# Patient Record
Sex: Male | Born: 1960 | Race: Black or African American | Hispanic: No | Marital: Single
Health system: Midwestern US, Community
[De-identification: ages and names within clinical notes are randomized; demographics above are authoritative.]

## PROBLEM LIST (undated history)

## (undated) DIAGNOSIS — Z8673 Personal history of transient ischemic attack (TIA), and cerebral infarction without residual deficits: Secondary | ICD-10-CM

## (undated) DIAGNOSIS — I77810 Thoracic aortic ectasia: Secondary | ICD-10-CM

## (undated) DIAGNOSIS — N183 Chronic kidney disease, stage 3 unspecified: Secondary | ICD-10-CM

## (undated) DIAGNOSIS — I779 Disorder of arteries and arterioles, unspecified: Secondary | ICD-10-CM

## (undated) DIAGNOSIS — Z789 Other specified health status: Secondary | ICD-10-CM

## (undated) DIAGNOSIS — I2510 Atherosclerotic heart disease of native coronary artery without angina pectoris: Secondary | ICD-10-CM

## (undated) DIAGNOSIS — F1490 Cocaine use, unspecified, uncomplicated: Secondary | ICD-10-CM

## (undated) DIAGNOSIS — I517 Cardiomegaly: Secondary | ICD-10-CM

## (undated) DIAGNOSIS — F1090 Alcohol use, unspecified, uncomplicated: Secondary | ICD-10-CM

## (undated) DIAGNOSIS — N529 Male erectile dysfunction, unspecified: Secondary | ICD-10-CM

## (undated) DIAGNOSIS — K759 Inflammatory liver disease, unspecified: Secondary | ICD-10-CM

## (undated) DIAGNOSIS — Z720 Tobacco use: Secondary | ICD-10-CM

## (undated) DIAGNOSIS — I10 Essential (primary) hypertension: Secondary | ICD-10-CM

## (undated) DIAGNOSIS — I340 Nonrheumatic mitral (valve) insufficiency: Secondary | ICD-10-CM

## (undated) HISTORY — DX: Thoracic aortic ectasia: I77.810

## (undated) HISTORY — DX: Male erectile dysfunction, unspecified: N52.9

## (undated) HISTORY — DX: Nonrheumatic mitral (valve) insufficiency: I34.0

## (undated) HISTORY — PX: BACK SURGERY: SHX140

## (undated) HISTORY — PX: JOINT REPLACEMENT: SHX530

## (undated) HISTORY — PX: FRACTURE SURGERY: SHX138

## (undated) HISTORY — DX: Tobacco use: Z72.0

## (undated) HISTORY — DX: Other specified health status: Z78.9

## (undated) HISTORY — DX: Alcohol use, unspecified, uncomplicated: F10.90

## (undated) HISTORY — DX: Disorder of arteries and arterioles, unspecified: I77.9

## (undated) HISTORY — DX: Atherosclerotic heart disease of native coronary artery without angina pectoris: I25.10

## (undated) HISTORY — DX: Cocaine use, unspecified, uncomplicated: F14.90

## (undated) HISTORY — DX: Cardiomegaly: I51.7

---

## 2011-03-02 ENCOUNTER — Ambulatory Visit: Payer: Self-pay | Admitting: Gastroenterology

## 2011-03-16 ENCOUNTER — Other Ambulatory Visit: Payer: Self-pay | Admitting: Gastroenterology

## 2011-03-16 ENCOUNTER — Ambulatory Visit (INDEPENDENT_AMBULATORY_CARE_PROVIDER_SITE_OTHER): Payer: Self-pay | Admitting: Gastroenterology

## 2011-03-16 DIAGNOSIS — B182 Chronic viral hepatitis C: Secondary | ICD-10-CM

## 2011-03-16 LAB — CBC WITH DIFFERENTIAL/PLATELET
Basophils Absolute: 0 10*3/uL (ref 0.0–0.1)
Basophils Relative: 1 % (ref 0–1)
Eosinophils Absolute: 0.1 10*3/uL (ref 0.0–0.7)
Eosinophils Relative: 2 % (ref 0–5)
MCH: 33.8 pg (ref 26.0–34.0)
MCV: 93.9 fL (ref 78.0–100.0)
Platelets: 117 10*3/uL — ABNORMAL LOW (ref 150–400)
RDW: 11.5 % (ref 11.5–15.5)

## 2011-03-16 LAB — IRON: Iron: 161 ug/dL (ref 42–165)

## 2011-03-16 LAB — IBC PANEL
%SAT: 48 % (ref 20–55)
TIBC: 334 ug/dL (ref 215–435)

## 2011-03-17 LAB — COMPLETE METABOLIC PANEL WITH GFR
ALT: 140 U/L — ABNORMAL HIGH (ref 0–53)
AST: 134 U/L — ABNORMAL HIGH (ref 0–37)
Alkaline Phosphatase: 89 U/L (ref 39–117)
Creat: 1.22 mg/dL (ref 0.50–1.35)
GFR, Est African American: 79 mL/min
Total Bilirubin: 0.7 mg/dL (ref 0.3–1.2)

## 2011-03-17 LAB — HEPATITIS B SURFACE ANTIBODY,QUALITATIVE: Hep B S Ab: NEGATIVE

## 2011-03-17 LAB — HEPATITIS B CORE ANTIBODY, TOTAL: Hep B Core Total Ab: NEGATIVE

## 2011-03-17 LAB — HEPATITIS B SURFACE ANTIGEN: Hepatitis B Surface Ag: NEGATIVE

## 2011-03-20 LAB — HEPATITIS C RNA QUANTITATIVE: HCV Quantitative Log: 6.73 {Log} — ABNORMAL HIGH (ref <1.63)
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
## 2011-04-13 NOTE — Progress Notes (Signed)
Addended by: Brooke Dare on: 04/13/2011 10:23 PM   Modules accepted: Orders

## 2011-07-06 ENCOUNTER — Other Ambulatory Visit: Payer: Self-pay | Admitting: Radiology

## 2011-07-07 ENCOUNTER — Encounter (HOSPITAL_COMMUNITY): Payer: Self-pay | Admitting: Pharmacy Technician

## 2011-07-11 ENCOUNTER — Encounter (HOSPITAL_COMMUNITY): Payer: Self-pay

## 2011-07-11 ENCOUNTER — Ambulatory Visit (HOSPITAL_COMMUNITY)
Admission: RE | Admit: 2011-07-11 | Discharge: 2011-07-11 | Disposition: A | Discharge status: Home / Self Care | Payer: Self-pay | Source: Ambulatory Visit | Attending: Gastroenterology | Admitting: Gastroenterology

## 2011-07-11 VITALS — BP 155/89 | HR 58 | Temp 97.2°F | Resp 18 | Ht 69.0 in | Wt 165.0 lb

## 2011-07-11 DIAGNOSIS — B1920 Unspecified viral hepatitis C without hepatic coma: Secondary | ICD-10-CM | POA: Insufficient documentation

## 2011-07-11 DIAGNOSIS — B182 Chronic viral hepatitis C: Secondary | ICD-10-CM

## 2011-07-11 HISTORY — DX: Inflammatory liver disease, unspecified: K75.9

## 2011-07-11 LAB — GLUCOSE, CAPILLARY: Glucose-Capillary: 286 mg/dL — ABNORMAL HIGH (ref 70–99)

## 2011-07-11 LAB — CBC
Hemoglobin: 16.5 g/dL (ref 13.0–17.0)
MCHC: 37 g/dL — ABNORMAL HIGH (ref 30.0–36.0)
RBC: 4.78 MIL/uL (ref 4.22–5.81)
WBC: 5.2 10*3/uL (ref 4.0–10.5)

## 2011-07-11 LAB — APTT: aPTT: 28 seconds (ref 24–37)

## 2011-07-11 LAB — PROTIME-INR
INR: 1.04 (ref 0.00–1.49)
Prothrombin Time: 13.8 seconds (ref 11.6–15.2)

## 2011-07-11 MED ORDER — SODIUM CHLORIDE 0.9 % IV SOLN: Freq: Once | INTRAVENOUS | Status: DC

## 2011-07-11 MED ORDER — MIDAZOLAM HCL 2 MG/2ML IJ SOLN
INTRAMUSCULAR | Status: AC
  Filled 2011-07-11: qty 4

## 2011-07-11 MED ORDER — HYDROCODONE-ACETAMINOPHEN 5-325 MG PO TABS
1.0000 | ORAL_TABLET | Freq: Four times a day (QID) | ORAL | Status: DC | PRN
  Administered 2011-07-11: 2 via ORAL

## 2011-07-11 MED ORDER — FENTANYL CITRATE 0.05 MG/ML IJ SOLN
INTRAMUSCULAR | Status: AC
  Filled 2011-07-11: qty 4

## 2011-07-11 MED ORDER — MIDAZOLAM HCL 5 MG/5ML IJ SOLN
INTRAMUSCULAR | Status: AC | PRN
  Administered 2011-07-11 (×2): 1 mg via INTRAVENOUS

## 2011-07-11 MED ORDER — FENTANYL CITRATE 0.05 MG/ML IJ SOLN
INTRAMUSCULAR | Status: AC | PRN
  Administered 2011-07-11 (×2): 25 ug via INTRAVENOUS

## 2011-07-11 NOTE — Procedures (Signed)
Procedure : random liver core needle biopsy Specimen : 18 g cores x 3 Complications : none immediate  Medications : 2 mg versed, 50 mcg fentanyl Patient tolerated well with no immediate complications.
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
## 2011-07-11 NOTE — ED Notes (Signed)
02 sat 96 R AIR

## 2011-07-11 NOTE — Discharge Instructions (Signed)
Liver Biopsy Care After Refer to this sheet in the next few weeks. These discharge instructions provide you with general information on caring for yourself after you leave the hospital. Your caregiver may also give you specific instructions. Your treatment has been planned according to the most current medical practices available, but unavoidable complications sometimes occur. If you have any problems or questions after discharge, please call your caregiver. HOME CARE INSTRUCTIONS   You should rest for 1 to 2 days or as instructed.   If you go home the same day as your procedure (outpatient), have a responsible adult take you home and stay with you overnight.   Do not lift more than 5 pounds or play contact sports for 2 weeks.   Do not drive for 24 hours after this test.   Do not take medicine containing aspirin or drink alcohol for 1 week after this test.   Change bandages (dressings) as directed.   Only take over-the-counter or prescription medicines for pain, discomfort, or fever as directed by your caregiver.  OBTAINING YOUR TEST RESULTS Not all test results are available during your visit. If your test results are not back during the visit, make an appointment with your caregiver to find out the results. Do not assume everything is normal if you have not heard from your caregiver or the medical facility. It is important for you to follow up on all of your test results. SEEK MEDICAL CARE IF:   You have increased bleeding (more than a small spot) from the biopsy site.   You have redness, swelling, or increasing pain in the biopsy site.   You have an oral temperature above 102 F (38.9 C).  SEEK IMMEDIATE MEDICAL CARE IF:   You develop swelling or pain in the belly (abdomen).   You develop a rash.   You have difficulty breathing, feel short of breath, or feel faint.   You develop any reaction or side effects to medicines given.  MAKE SURE YOU:   Understand these instructions.    Will watch your condition.   Will get help right away if you are not doing well or get worse.  Document Released: 08/19/2004 Document Revised: 01/19/2011 Document Reviewed: 09/12/2007 ExitCare Patient Information 2012 [employer].Liver Biopsy Care After Refer to this sheet in the next few weeks. These discharge instructions provide you with general information on caring for yourself after you leave the hospital. Your caregiver may also give you specific instructions. Your treatment has been planned according to the most current medical practices available, but unavoidable complications sometimes occur. If you have any problems or questions after discharge, please call your caregiver. HOME CARE INSTRUCTIONS   You should rest for 1 to 2 days or as instructed.   If you go home the same day as your procedure (outpatient), have a responsible adult take you home and stay with you overnight.   Do not lift more than 5 pounds or play contact sports for 2 weeks.   Do not drive for 24 hours after this test.   Do not take medicine containing aspirin or drink alcohol for 1 week after this test.   Change bandages (dressings) as directed.   Only take over-the-counter or prescription medicines for pain, discomfort, or fever as directed by your caregiver.  OBTAINING YOUR TEST RESULTS Not all test results are available during your visit. If your test results are not back during the visit, make an appointment with your caregiver to find out the results. Do   not assume everything is normal if you have not heard from your caregiver or the medical facility. It is important for you to follow up on all of your test results. SEEK MEDICAL CARE IF:   You have increased bleeding (more than a small spot) from the biopsy site.   You have redness, swelling, or increasing pain in the biopsy site.   You have an oral temperature above 102 F (38.9 C).  SEEK IMMEDIATE MEDICAL CARE IF:   You develop swelling or  pain in the belly (abdomen).   You develop a rash.   You have difficulty breathing, feel short of breath, or feel faint.   You develop any reaction or side effects to medicines given.  MAKE SURE YOU:   Understand these instructions.   Will watch your condition.   Will get help right away if you are not doing well or get worse.  Document Released: 08/19/2004 Document Revised: 01/19/2011 Document Reviewed: 09/12/2007 Exit[employer] Patient Information 2012 [employer]. 

## 2011-08-10 ENCOUNTER — Encounter: Payer: Self-pay | Admitting: Gastroenterology

## 2011-09-04 ENCOUNTER — Emergency Department (INDEPENDENT_AMBULATORY_CARE_PROVIDER_SITE_OTHER)
Admission: EM | Admit: 2011-09-04 | Discharge: 2011-09-04 | Disposition: A | Discharge status: Home / Self Care | Payer: Self-pay | Source: Home / Self Care | Attending: Family Medicine | Admitting: Family Medicine

## 2011-09-04 ENCOUNTER — Encounter (HOSPITAL_COMMUNITY): Payer: Self-pay

## 2011-09-04 DIAGNOSIS — G5600 Carpal tunnel syndrome, unspecified upper limb: Secondary | ICD-10-CM

## 2011-09-04 DIAGNOSIS — G609 Hereditary and idiopathic neuropathy, unspecified: Secondary | ICD-10-CM

## 2011-09-04 DIAGNOSIS — G5601 Carpal tunnel syndrome, right upper limb: Secondary | ICD-10-CM

## 2011-09-04 DIAGNOSIS — G629 Polyneuropathy, unspecified: Secondary | ICD-10-CM

## 2011-09-04 MED ORDER — TRAMADOL HCL 50 MG PO TABS: 50.0000 mg | ORAL_TABLET | Freq: Four times a day (QID) | ORAL | Status: AC | PRN

## 2011-09-04 MED ORDER — GABAPENTIN (ONCE-DAILY) 300 MG PO TABS: 1.0000 | ORAL_TABLET | Freq: Every day | ORAL | Status: DC
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
## 2011-09-04 NOTE — ED Notes (Signed)
Pt complains of R hand pain with swelling in middle three fingers persisting since 7/17. Pt states that fingers are numb and tingle. Denies pain when pressure applied to fingers. Pt states that it "hurts when I try to make a fist." Pt states that numbness and tingling have gotten progressively worse.

## 2011-10-17 ENCOUNTER — Encounter (HOSPITAL_COMMUNITY): Payer: Self-pay

## 2011-10-17 ENCOUNTER — Emergency Department (HOSPITAL_COMMUNITY): Payer: Self-pay

## 2011-10-17 ENCOUNTER — Emergency Department (HOSPITAL_COMMUNITY)
Admission: EM | Admit: 2011-10-17 | Discharge: 2011-10-17 | Disposition: A | Discharge status: Home / Self Care | Payer: Self-pay | Attending: Emergency Medicine | Admitting: Emergency Medicine

## 2011-10-17 DIAGNOSIS — M545 Low back pain, unspecified: Secondary | ICD-10-CM | POA: Insufficient documentation

## 2011-10-17 DIAGNOSIS — M79609 Pain in unspecified limb: Secondary | ICD-10-CM | POA: Insufficient documentation

## 2011-10-17 DIAGNOSIS — Z87891 Personal history of nicotine dependence: Secondary | ICD-10-CM | POA: Insufficient documentation

## 2011-10-17 DIAGNOSIS — R209 Unspecified disturbances of skin sensation: Secondary | ICD-10-CM | POA: Insufficient documentation

## 2011-10-17 DIAGNOSIS — E119 Type 2 diabetes mellitus without complications: Secondary | ICD-10-CM | POA: Insufficient documentation

## 2011-10-17 DIAGNOSIS — Z79899 Other long term (current) drug therapy: Secondary | ICD-10-CM | POA: Insufficient documentation

## 2011-10-17 DIAGNOSIS — R5381 Other malaise: Secondary | ICD-10-CM | POA: Insufficient documentation

## 2011-10-17 DIAGNOSIS — M549 Dorsalgia, unspecified: Secondary | ICD-10-CM

## 2011-10-17 DIAGNOSIS — R29898 Other symptoms and signs involving the musculoskeletal system: Secondary | ICD-10-CM | POA: Insufficient documentation

## 2011-10-17 DIAGNOSIS — M899 Disorder of bone, unspecified: Secondary | ICD-10-CM | POA: Insufficient documentation

## 2011-10-17 DIAGNOSIS — B1920 Unspecified viral hepatitis C without hepatic coma: Secondary | ICD-10-CM | POA: Insufficient documentation

## 2011-10-17 LAB — URINALYSIS, ROUTINE W REFLEX MICROSCOPIC
Glucose, UA: NEGATIVE mg/dL
Ketones, ur: NEGATIVE mg/dL
Leukocytes, UA: NEGATIVE
Specific Gravity, Urine: 1.015 (ref 1.005–1.030)
pH: 6 (ref 5.0–8.0)

## 2011-10-17 MED ORDER — IBUPROFEN 800 MG PO TABS: 800.0000 mg | ORAL_TABLET | Freq: Three times a day (TID) | ORAL | Status: AC

## 2011-10-17 MED ORDER — OXYCODONE-ACETAMINOPHEN 5-325 MG PO TABS
2.0000 | ORAL_TABLET | Freq: Once | ORAL | Status: AC
  Administered 2011-10-17: 2 via ORAL

## 2011-10-17 MED ORDER — IBUPROFEN 800 MG PO TABS
800.0000 mg | ORAL_TABLET | Freq: Once | ORAL | Status: AC
  Administered 2011-10-17: 800 mg via ORAL
  Filled 2011-10-17: qty 1

## 2011-10-17 MED ORDER — OXYCODONE-ACETAMINOPHEN 5-325 MG PO TABS: 2.0000 | ORAL_TABLET | ORAL | Status: AC | PRN

## 2011-10-17 MED ORDER — METHYLPREDNISOLONE 4 MG PO KIT: PACK | ORAL | Status: AC

## 2011-10-17 NOTE — ED Notes (Signed)
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 He lives at a sober living house. He denies any incontinence of bowel or bladder. He moves about in bed without difficulty. He is able to ambulate from chair to bed without difficulty.

## 2011-10-17 NOTE — ED Notes (Signed)
Notified RN of CBG [id number]
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
## 2011-10-17 NOTE — ED Notes (Signed)
Pt reports pain in the lower back into left shoulder and left leg

## 2012-02-08 ENCOUNTER — Emergency Department (HOSPITAL_COMMUNITY)
Admission: EM | Admit: 2012-02-08 | Discharge: 2012-02-08 | Disposition: A | Discharge status: Home / Self Care | Payer: No Typology Code available for payment source | Attending: Emergency Medicine | Admitting: Emergency Medicine

## 2012-02-08 ENCOUNTER — Encounter (HOSPITAL_COMMUNITY): Payer: Self-pay | Admitting: Emergency Medicine

## 2012-02-08 DIAGNOSIS — Z9889 Other specified postprocedural states: Secondary | ICD-10-CM | POA: Insufficient documentation

## 2012-02-08 DIAGNOSIS — F17200 Nicotine dependence, unspecified, uncomplicated: Secondary | ICD-10-CM | POA: Insufficient documentation

## 2012-02-08 DIAGNOSIS — Y93I9 Activity, other involving external motion: Secondary | ICD-10-CM | POA: Insufficient documentation

## 2012-02-08 DIAGNOSIS — Z9660 Presence of unspecified orthopedic joint implant: Secondary | ICD-10-CM | POA: Insufficient documentation

## 2012-02-08 DIAGNOSIS — S139XXA Sprain of joints and ligaments of unspecified parts of neck, initial encounter: Secondary | ICD-10-CM | POA: Insufficient documentation

## 2012-02-08 DIAGNOSIS — S46909A Unspecified injury of unspecified muscle, fascia and tendon at shoulder and upper arm level, unspecified arm, initial encounter: Secondary | ICD-10-CM | POA: Insufficient documentation

## 2012-02-08 DIAGNOSIS — E119 Type 2 diabetes mellitus without complications: Secondary | ICD-10-CM | POA: Insufficient documentation

## 2012-02-08 DIAGNOSIS — Y92410 Unspecified street and highway as the place of occurrence of the external cause: Secondary | ICD-10-CM | POA: Insufficient documentation

## 2012-02-08 DIAGNOSIS — Z794 Long term (current) use of insulin: Secondary | ICD-10-CM | POA: Insufficient documentation

## 2012-02-08 DIAGNOSIS — Z79899 Other long term (current) drug therapy: Secondary | ICD-10-CM | POA: Insufficient documentation

## 2012-02-08 DIAGNOSIS — Z8619 Personal history of other infectious and parasitic diseases: Secondary | ICD-10-CM | POA: Insufficient documentation

## 2012-02-08 DIAGNOSIS — S4980XA Other specified injuries of shoulder and upper arm, unspecified arm, initial encounter: Secondary | ICD-10-CM | POA: Insufficient documentation

## 2012-02-08 DIAGNOSIS — S161XXA Strain of muscle, fascia and tendon at neck level, initial encounter: Secondary | ICD-10-CM

## 2012-02-08 MED ORDER — IBUPROFEN 600 MG PO TABS: 600.0000 mg | ORAL_TABLET | Freq: Four times a day (QID) | ORAL | Status: DC | PRN

## 2012-02-08 MED ORDER — METHOCARBAMOL 500 MG PO TABS
500.0000 mg | ORAL_TABLET | Freq: Once | ORAL | Status: AC
  Administered 2012-02-08: 500 mg via ORAL
  Filled 2012-02-08: qty 1

## 2012-02-08 MED ORDER — TRAMADOL HCL 50 MG PO TABS: 50.0000 mg | ORAL_TABLET | Freq: Four times a day (QID) | ORAL | Status: DC | PRN

## 2012-02-08 MED ORDER — METHOCARBAMOL 500 MG PO TABS: 500.0000 mg | ORAL_TABLET | Freq: Two times a day (BID) | ORAL | Status: DC

## 2012-02-08 MED ORDER — KETOROLAC TROMETHAMINE 60 MG/2ML IM SOLN
60.0000 mg | Freq: Once | INTRAMUSCULAR | Status: AC
  Administered 2012-02-08: 60 mg via INTRAMUSCULAR
  Filled 2012-02-08: qty 2
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
## 2012-02-08 NOTE — ED Notes (Signed)
C/O neck, bilateral shoulder pain and headache.
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
## 2012-03-11 ENCOUNTER — Encounter (HOSPITAL_COMMUNITY): Payer: Self-pay | Admitting: Neurology

## 2012-03-11 ENCOUNTER — Emergency Department (HOSPITAL_COMMUNITY): Payer: Self-pay

## 2012-03-11 ENCOUNTER — Emergency Department (HOSPITAL_COMMUNITY)
Admission: EM | Admit: 2012-03-11 | Discharge: 2012-03-11 | Disposition: A | Discharge status: Home / Self Care | Payer: Self-pay | Attending: Emergency Medicine | Admitting: Emergency Medicine

## 2012-03-11 DIAGNOSIS — Z794 Long term (current) use of insulin: Secondary | ICD-10-CM | POA: Insufficient documentation

## 2012-03-11 DIAGNOSIS — Z8619 Personal history of other infectious and parasitic diseases: Secondary | ICD-10-CM | POA: Insufficient documentation

## 2012-03-11 DIAGNOSIS — M25559 Pain in unspecified hip: Secondary | ICD-10-CM

## 2012-03-11 DIAGNOSIS — F17200 Nicotine dependence, unspecified, uncomplicated: Secondary | ICD-10-CM | POA: Insufficient documentation

## 2012-03-11 DIAGNOSIS — E119 Type 2 diabetes mellitus without complications: Secondary | ICD-10-CM | POA: Insufficient documentation

## 2012-03-11 DIAGNOSIS — Z79899 Other long term (current) drug therapy: Secondary | ICD-10-CM | POA: Insufficient documentation

## 2012-03-11 DIAGNOSIS — M549 Dorsalgia, unspecified: Secondary | ICD-10-CM

## 2012-03-11 MED ORDER — HYDROCODONE-ACETAMINOPHEN 5-325 MG PO TABS
2.0000 | ORAL_TABLET | Freq: Once | ORAL | Status: AC
  Administered 2012-03-11: 2 via ORAL
  Filled 2012-03-11: qty 2

## 2012-03-11 MED ORDER — IBUPROFEN 600 MG PO TABS: 600.0000 mg | ORAL_TABLET | Freq: Three times a day (TID) | ORAL | Status: DC | PRN

## 2012-03-11 MED ORDER — HYDROCODONE-ACETAMINOPHEN 5-325 MG PO TABS: 1.0000 | ORAL_TABLET | Freq: Four times a day (QID) | ORAL | Status: DC | PRN

## 2012-03-11 NOTE — ED Notes (Addendum)
Pt reporting numbness, pins and needles to left side of body x 3 days. States hands swollen for several days. Pt ambulatory. No neuro deficits. A & O x 4. States blood sugar is high.
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
## 2012-03-18 ENCOUNTER — Telehealth (HOSPITAL_COMMUNITY): Payer: Self-pay | Admitting: Emergency Medicine

## 2014-03-20 ENCOUNTER — Inpatient Hospital Stay: Admit: 2014-03-20 | Discharge: 2014-03-21 | Disposition: A | Discharge status: Home / Self Care | Attending: Hospitalist

## 2014-03-20 LAB — HEPATIC FUNCTION PANEL
ALT: 18 U/L (ref 12–78)
AST: 11 U/L — ABNORMAL LOW (ref 15–37)
Albumin,Serum: 3.4 g/dL (ref 3.1–4.6)
Alkaline Phosphatase: 104 U/L (ref 45–117)
Bilirubin, Direct: <0.1 mg/dL (ref 0.0–0.2)
Total Bilirubin: 0.2 mg/dL (ref 0.2–1.0)
Total Protein: 7.6 g/dL (ref 6.4–8.2)

## 2014-03-20 LAB — MANUAL DIFFERENTIAL
Absolute Baso #: 0 10*3/uL (ref 0.0–0.2)
Absolute Eos #: 0 10*3/uL (ref 0.0–0.5)
Absolute Lymph #: 1.7 10*3/uL (ref 1.1–4.5)
Absolute Mono #: 1.2 10*3/uL — ABNORMAL HIGH (ref 0.2–1.1)
Absolute Neut #: 1.7 10*3/uL — ABNORMAL LOW (ref 2.2–8.2)
Bands: 3 %
Basophils: 0 %
Eosinophils: 0 %
Lymphocytes: 38 %
Monocytes: 25 %
RBC Morphology: NORMAL
Seg Neutrophils: 34 %
TOTAL CELLS COUNTED: 100

## 2014-03-20 LAB — CBC WITH AUTO DIFFERENTIAL
Hematocrit: 36.2 % — ABNORMAL LOW (ref 40.0–52.0)
Hemoglobin: 11.7 g/dl — ABNORMAL LOW (ref 13.0–18.0)
MCH: 28.6 pg (ref 26.0–34.0)
MCHC: 32.4 % (ref 32.0–36.0)
MCV: 88.2 fl (ref 80.0–98.0)
MPV: 7.3 fl — ABNORMAL LOW (ref 7.4–10.4)
Platelets: 252 10*3/uL (ref 140–440)
RBC: 4.11 10*6/uL — ABNORMAL LOW (ref 4.40–5.90)
RDW: 14.4 % (ref 11.5–14.5)
WBC: 4.6 10*3/uL (ref 3.6–10.7)

## 2014-03-20 LAB — BASIC METABOLIC PANEL
Anion Gap: 7
BUN: 13 mg/dL (ref 7–25)
CO2: 26 mmol/L (ref 21–32)
Calcium: 8.4 mg/dL (ref 8.2–10.1)
Chloride: 107 mmol/L (ref 98–109)
Creatinine: 0.97 mg/dL (ref 0.55–1.40)
EGFR IF NonAfrican American: >60 mL/min (ref >60)
Glucose: 98 mg/dL (ref 70–100)
Potassium: 4.2 mmol/L (ref 3.5–5.1)
Sodium: 140 mmol/L (ref 135–145)
eGFR African American: >60 mL/min (ref >60)

## 2014-03-20 LAB — C-REACTIVE PROTEIN: CRP: 10.4 mg/L — ABNORMAL HIGH (ref 0.0–2.9)

## 2014-03-20 NOTE — ED Provider Notes (Signed)
Derrick Irwin:          Nangle, Lazarus     DOS:           03/20/2014  MR #:             [id number]             ACCOUNT #:     [id number]  DATE OF BIRTH:    02/22/1960              AGE:           53      HISTORY OF PRESENT ILLNESS:    PERTINENT HISTORY OF PRESENT  ILLNESS. patient seen with Dr.  Brock RaSchuckman.  The  patient is a 54 year old male presents in the emergency department  with a  history of many months of back pain hip pain and neck pain.  Patient  reports  this, but gradually and consistently with arthritis.  The patient has  had  multiple injuries in the sports and playing football.  Patient has  been  followed by the primary care physician who also notes arthritis.  Patient was  scheduled to see orthopedic surgeons but has been unable to get in to  see them  due to patient scheduling.  Patient patient reports that the pain is  intermittent as dull achy pain in the left hip and lumbar regions  bilaterally.  As modified by movement and ambulation made worse by lifting in the  weather.  Nothing seems to relieve the pain with some relief with the point of  his Norco  prescription.  The patient also complains of a radiation of pain so  down the  left leg.  Patient denies any fevers chills cough nausea vomiting.    PERTINENT PAST/ FAMILY/SOCIAL HISTORY patient's past medical history  of  hypertension and sciatica and arthritis.  Patient's a tonsillectomy  and  lipomectomy.  Patient smokes half a pack a day drinks 3-4 drinks a day  and has  remote history of drug abuse patient is currently on Norco and has  filled and  since.        PHYSICAL EXAM VITALS: Afebrile, unremarkable.  GEN: Nontoxic, speaking full sentences.  HEENT: NC/AT, EOMI, PERRL, anicteric sclera bilaterally without  conjunctival  injection. MMM.  CARDIO: NR/RR, no JVD or carotid bruits; normal S1 and S2 without  murmurs/rubs/gallops. Peripheral pulses palpable.  RESP: Normal effort without accessory muscle use, CTAB with  moderate  airflow  bilaterally; no wheezes/crackles.  ABDOMEN: Soft; warm; NT/ND; normoactive bowel sounds without bruits or  other  high-pitched sounds; no CVA tenderness; no  hepatosplenomegaly/palpable  masses/pulsatile masses/guarding/rebound tenderness.  NEURO: AOx3. CN II-XII intact. SILT x5 LE and x3 UE distally  (bilaterally);  normal motor strength to upper and lower extremities bilaterally.  EXT: positive logroll left-sided, , no edema.  SKIN: Normal color, no obvious rash.  back: Patient is tender to palpation over bilateral SI joints    MEDICAL DECISION MAKING:    SIGNIFICANT FINDINGS/ED COURSE/MEDICAL DECISION MAKING/TREATMENT  PLAN x-rays  reveal significant arthritis bone-on-bone with avascular necrosis of  the left  hip.  No other pathology is revealed by x-rays.  Patient does not  desire  injections over the SI joint.  Patient has had difficult time getting  in to see  orthopedic surgeons.  Information regarding her [facility] will  be given to the patient and will be instructed to call to see if they  get in  any sooner.    PROBLEM LIST:       Admit Reason:     L hip/back/neck pain: Entered Date: 20-Mar-2014 15:38, Entered By:  Standley Brooking, Status: Active        DIAGNOSIS arthritis of left hip  avascular necrosis of left hip        ADDITIONAL INFORMATION The Emergency Medicine attending physician  was present  in the Emergency Department, who reviewed case management, and  approved  evaluation/treatment.      COPIES SENT TO::     CHAFFEE, ROGER(PCP): [id number]    Electronic Signatures:  Roosvelt Harps (MD)  (Signed 21-Mar-2014 17:15)   Co-Signer: HISTORY OF PRESENT ILLNESS, PHYSICAL EXAM, PROBLEM LIST,  DIAGNOSIS,  Additional Infomation, Copies to be sent to:  Adelene Amas (MD)  (Signed 20-Mar-2014 18:04)   Authored: HISTORY OF PRESENT ILLNESS, PHYSICAL EXAM, MEDICAL DECISION  MAKING,  PROBLEM LIST, DIAGNOSIS, Additional Infomation, Copies to be sent to:      Last  Updated: 21-Mar-2014 17:15 by Roosvelt Harps (MD)            Please see T-Sheet, initial assessment, and physician orders for  further details.    Dictating Physician: Ladean Raya, MD  Original Electronic Signature Date: 03/20/2014 04:37 P  MZT  Document #: [id number]    cc:  Fredrik Cove B. Theda Sers, MD       [address]       [address]       [place] [address]

## 2014-07-02 NOTE — ED Provider Notes (Signed)
Derrick Irwin:          Derrick Irwin, Derrick Irwin     DOS:           07/02/2014  MR #:             [id number]             ACCOUNT #:     [id number]  DATE OF BIRTH:    21-Jul-1960              AGE:           53      HISTORY OF PRESENT ILLNESS:    PERTINENT HISTORY OF PRESENT  ILLNESS. Patient presents with left  hip, back,  and neck pain that has been getting worse over the past week.  Patient  states  she has had this pain for several months.  Patient states he has a  history of  avascular necrosis of his left hip and was initially scheduled to see  an  orthopedic surgeon however, he was unable to see the orthopedic  surgeon.  Patient states his primary care physician at access point refer him to  a pain  management physician which he has not seen yet.  Patient denies any  new  injuries.  Patient also admits to some pain and swelling over his left  cheek.  Patient states this has drained in the past but currently is not  draining.  Patient denies any fevers or chills.    PERTINENT PAST/ FAMILY/SOCIAL HISTORY Past medical history includes  arthritis  and avascular necrosis of the left hip        PHYSICAL EXAM Vital signs are stable.  Patient is afebrile.  Patient  is in no  acute distress.  There is tenderness and spasm of the cervical  paraspinal  muscles as well as over the left lumbar paraspinal muscles.  There is  tenderness over the posterior aspect of the left hip.  There is  limited range  of motion of the cervical spine, lumbar spine, and left hip.  Strength  is 5  over 5 bilaterally upper and lower extremities.  There are no sensory  deficits  noted.  Deep tendon reflexes were 2 over 4 bilaterally.  Skin is warm  and dry.  There is edema, induration, and erythema over the left cheek.  I do  not  appreciate any fluctuance.  The remaining physical exam is within  normal  limits.    MEDICAL DECISION MAKING:    SIGNIFICANT FINDINGS/ED COURSE/MEDICAL DECISION MAKING/TREATMENT  PLAN Patient  was advised that he needs to  continue using warm compresses to his  facial  abscess.  It is not amenable to incision and drainage at this time.  Patient  was given prescriptions for Bactrim, Clinoril, and Flexeril.  Patient  was  instructed to follow up with his primary care physician at access  point.  Patient was also instructed to continue with his referral to pain  management.  Patient understood and was agreeable with the plan.  All questions  were  answered.    PROBLEM LIST:       ED Diagnosis:     Facial abscess (L02.01): Entered Date: 02-Jul-2014 18:43, Entered  By:  Alan RipperSCHWIGER, Wesam Gearhart, Status: Active, ICD-10: L02.01     Avascular necrosis of hip, left (M87.052): Entered Date:  02-Jul-2014 18:42,  Entered By: Alan RipperSCHWIGER, Cleta Heatley, Status: Active, ICD-10: M87.052     Acute exacerbation of chronic low back pain (M54.5): Entered Date:  02-Jul-2014 18:42, Entered By: Alan RipperSCHWIGER, Emilliano Dilworth, Status: Active, ICD-10:  M54.5       Admit Reason:     hip / back pain: Entered Date: 02-Jul-2014 18:13, Entered By:  Standley BrookingINTERFACES,  INTERFACES, Status: Active        ADDITIONAL INFORMATION This document was dictated using a voice  recognition  software.  All efforts were made to ensure its accuracy however, some  errors  may occur.      COPIES SENT TO::     NO, PCP DOCTOR(PCP): [id number]    Electronic Signatures:  Cristan Scherzer (DO)  (Signed 02-Jul-2014 18:43)   Authored: HISTORY OF PRESENT ILLNESS, PHYSICAL EXAM, MEDICAL DECISION  MAKING,  PROBLEM LIST, Additional Infomation, Copies to be sent to:      Last Updated: 02-Jul-2014 18:43 by Alan RipperSCHWIGER, Avanelle Pixley (DO)            Please see T-Sheet, initial assessment, and physician orders for  further details.    Dictating Physician: Melton AlarEric J. Kirstein Baxley, DO  Original Electronic Signature Date: 07/02/2014 06:43 P  EJS  Document #: [id number]    cc:  PCP No       Soarian

## 2014-12-08 ENCOUNTER — Inpatient Hospital Stay: Admit: 2014-12-08 | Discharge: 2014-12-08 | Disposition: A | Discharge status: Home / Self Care

## 2014-12-08 NOTE — ED Provider Notes (Signed)
Derrick Irwin:          Derrick Irwin, Derrick Irwin     DOS:           12/08/2014  MR #:             [id number]             ACCOUNT #:     [id number]  DATE OF BIRTH:    1960-06-21              AGE:           53      HISTORY OF PRESENT ILLNESS:    PERTINENT HISTORY OF PRESENT  ILLNESS. This patient was seen and  evaluated  with Dr. Clois ComberGallo.  Chief complaint: Left hip pain  54 year old male presents complaining of left hip pain  4 years.  He  is a  history of avascular necrosis.  He supposed to see an orthopedic  doctor for  evaluation for a hip replacement, however, patient states he has been  busy with  work.  He has been putting it off.  His pain is worse with movement.  No  paresthesias, weakness, or loss of function.  He is tried an  anti-inflammatory  in the past with minimal relief.  Patient states he also has back  pain.  No  loss of bowel or bladder function.  This is a chronic problem.  No  paresthesias  or weakness.  No fever, chills, or sweats.  All other review systems  unremarkable.    PERTINENT PAST/ FAMILY/SOCIAL HISTORY PMH: History of avascular  necrosis,  chronic pain  admits to occasional alcohol use.  Admits to tobacco use.  No drug  use.        PHYSICAL EXAM Vitals: Noted.  WJX:BJYN-WGNFAOZHYGen:Well-developed, well-nourished in no acute distress.  Head: Normocephalic, atraumatic.  Neck: Supple. Nontender.  CVS: Regular rate and rhythm.  Resp: No distress.  CTA.  Abd: Soft, nontender, and nondistended.  Back: Normal inspection.  Mild tenderness to palpation in the left  paraspinal  region.  No spinal tenderness.  Ext: Left hip shows no deformity.  Full range motion intact, plain  elicited.  Normal inspection of skin.  Good strength and sensation.  Compartments  are  soft.  2+ pedal pulse.  Skin: Normal color.  Neuro: Alert and oriented with no gross focal neurological deficits.    MEDICAL DECISION MAKING:    SIGNIFICANT FINDINGS/ED COURSE/MEDICAL DECISION MAKING/TREATMENT  PLAN After  evaluations patient was given Naprosyn and  one Norco for pain control.  He will  be sent home with prescriptions for both.  He was stressed the  importance of  following up with his PCP for orthopedic referral.  Patient  understood.  He is  made aware he cannot use the Emergency Room for management of chronic  pain.  He  should also ice.  Discharged home in stable condition.    PROBLEM LIST:       ED Diagnosis:     Chronic back pain (M54.9): Entered Date: 08-Dec-2014 19:20, Entered  By:  Hilton SinclairEILLY, Terryon Pineiro T, Status: Active, ICD-10: M54.9     Chronic left hip pain (M25.552): Entered Date: 08-Dec-2014 19:20,  Entered  By: Hilton SinclairEILLY, Burnie Therien T, Status: Active, ICD-10: Q65.784: M25.552        ADDITIONAL INFORMATION If the physician assistant/nurse practitioner  was  involved in patient care, I personally performed and participated in  all the  above services (including HPI and PE). I have reviewed with  the  physician  assistant/nurse practitioner the history and confirmed the findings  with the  patient. I personally performed all surgical procedures in the medical  record  unless otherwise indicated.      COPIES SENT TO::     NO, PCP DOCTOR(PCP): [id number]    Electronic Signatures:  Twanna Hy (MD)  (Signed 08-Dec-2014 19:29)   Co-Signer: HISTORY OF PRESENT ILLNESS, PHYSICAL EXAM, MEDICAL  DECISION MAKING,  PROBLEM LIST, Additional Infomation, Copies to be sent to:  Hilton Sinclair T (PA)  (Signed 08-Dec-2014 19:20)   Authored: HISTORY OF PRESENT ILLNESS, PHYSICAL EXAM, MEDICAL DECISION  MAKING,  PROBLEM LIST, Additional Infomation, Copies to be sent to:      Last Updated: 08-Dec-2014 19:29 by Twanna Hy (MD)            Please see T-Sheet, initial assessment, and physician orders for  further details.    Dictating Physician: Nanci Pina, PA-C  Original Electronic Signature Date: 12/08/2014 07:20 P  MTR  Document #: [id number]    cc:  PCP No       Soarian

## 2017-02-26 DIAGNOSIS — B182 Chronic viral hepatitis C: Secondary | ICD-10-CM | POA: Insufficient documentation

## 2017-02-26 DIAGNOSIS — F1010 Alcohol abuse, uncomplicated: Secondary | ICD-10-CM | POA: Insufficient documentation

## 2017-05-20 DIAGNOSIS — I2510 Atherosclerotic heart disease of native coronary artery without angina pectoris: Secondary | ICD-10-CM | POA: Insufficient documentation

## 2019-05-02 DIAGNOSIS — F17210 Nicotine dependence, cigarettes, uncomplicated: Secondary | ICD-10-CM | POA: Insufficient documentation

## 2020-07-14 ENCOUNTER — Other Ambulatory Visit: Payer: Self-pay

## 2020-07-14 ENCOUNTER — Emergency Department (HOSPITAL_COMMUNITY): Payer: Medicaid Other

## 2020-07-14 ENCOUNTER — Inpatient Hospital Stay (HOSPITAL_COMMUNITY)
Admission: EM | Admit: 2020-07-14 | Discharge: 2020-07-16 | DRG: 065 | Disposition: A | Discharge status: Home / Self Care | Payer: Medicaid Other | Attending: Internal Medicine | Admitting: Internal Medicine

## 2020-07-14 DIAGNOSIS — Z888 Allergy status to other drugs, medicaments and biological substances status: Secondary | ICD-10-CM | POA: Diagnosis not present

## 2020-07-14 DIAGNOSIS — I160 Hypertensive urgency: Secondary | ICD-10-CM | POA: Diagnosis not present

## 2020-07-14 DIAGNOSIS — Z79899 Other long term (current) drug therapy: Secondary | ICD-10-CM

## 2020-07-14 DIAGNOSIS — N1831 Chronic kidney disease, stage 3a: Secondary | ICD-10-CM | POA: Diagnosis present

## 2020-07-14 DIAGNOSIS — I10 Essential (primary) hypertension: Secondary | ICD-10-CM | POA: Diagnosis present

## 2020-07-14 DIAGNOSIS — E1165 Type 2 diabetes mellitus with hyperglycemia: Secondary | ICD-10-CM | POA: Diagnosis present

## 2020-07-14 DIAGNOSIS — N183 Chronic kidney disease, stage 3 unspecified: Secondary | ICD-10-CM | POA: Diagnosis present

## 2020-07-14 DIAGNOSIS — E1122 Type 2 diabetes mellitus with diabetic chronic kidney disease: Secondary | ICD-10-CM | POA: Diagnosis present

## 2020-07-14 DIAGNOSIS — I161 Hypertensive emergency: Secondary | ICD-10-CM | POA: Diagnosis present

## 2020-07-14 DIAGNOSIS — Z7984 Long term (current) use of oral hypoglycemic drugs: Secondary | ICD-10-CM

## 2020-07-14 DIAGNOSIS — I129 Hypertensive chronic kidney disease with stage 1 through stage 4 chronic kidney disease, or unspecified chronic kidney disease: Secondary | ICD-10-CM | POA: Diagnosis present

## 2020-07-14 DIAGNOSIS — I674 Hypertensive encephalopathy: Secondary | ICD-10-CM | POA: Diagnosis present

## 2020-07-14 DIAGNOSIS — I63529 Cerebral infarction due to unspecified occlusion or stenosis of unspecified anterior cerebral artery: Principal | ICD-10-CM | POA: Diagnosis present

## 2020-07-14 DIAGNOSIS — Z91018 Allergy to other foods: Secondary | ICD-10-CM | POA: Diagnosis not present

## 2020-07-14 DIAGNOSIS — E785 Hyperlipidemia, unspecified: Secondary | ICD-10-CM | POA: Diagnosis present

## 2020-07-14 DIAGNOSIS — F17210 Nicotine dependence, cigarettes, uncomplicated: Secondary | ICD-10-CM | POA: Diagnosis present

## 2020-07-14 DIAGNOSIS — I639 Cerebral infarction, unspecified: Secondary | ICD-10-CM | POA: Diagnosis not present

## 2020-07-14 DIAGNOSIS — Z716 Tobacco abuse counseling: Secondary | ICD-10-CM

## 2020-07-14 DIAGNOSIS — Z7982 Long term (current) use of aspirin: Secondary | ICD-10-CM | POA: Diagnosis not present

## 2020-07-14 DIAGNOSIS — F1410 Cocaine abuse, uncomplicated: Secondary | ICD-10-CM | POA: Diagnosis present

## 2020-07-14 DIAGNOSIS — Z20822 Contact with and (suspected) exposure to covid-19: Secondary | ICD-10-CM | POA: Diagnosis present

## 2020-07-14 DIAGNOSIS — Z794 Long term (current) use of insulin: Secondary | ICD-10-CM

## 2020-07-14 DIAGNOSIS — E119 Type 2 diabetes mellitus without complications: Secondary | ICD-10-CM | POA: Diagnosis not present

## 2020-07-14 DIAGNOSIS — I159 Secondary hypertension, unspecified: Secondary | ICD-10-CM

## 2020-07-14 DIAGNOSIS — I6389 Other cerebral infarction: Secondary | ICD-10-CM | POA: Diagnosis not present

## 2020-07-14 LAB — CBG MONITORING, ED: Glucose-Capillary: 116 mg/dL — ABNORMAL HIGH (ref 70–99)

## 2020-07-14 LAB — BASIC METABOLIC PANEL
Anion gap: 5 (ref 5–15)
BUN: 15 mg/dL (ref 6–20)
CO2: 25 mmol/L (ref 22–32)
Calcium: 9.5 mg/dL (ref 8.9–10.3)
Chloride: 108 mmol/L (ref 98–111)
Creatinine, Ser: 1.79 mg/dL — ABNORMAL HIGH (ref 0.61–1.24)
GFR, Estimated: 43 mL/min — ABNORMAL LOW (ref >60)
Glucose, Bld: 101 mg/dL — ABNORMAL HIGH (ref 70–99)
Potassium: 4.3 mmol/L (ref 3.5–5.1)
Sodium: 138 mmol/L (ref 135–145)

## 2020-07-14 LAB — CBC
HCT: 47.9 % (ref 39.0–52.0)
Hemoglobin: 16.2 g/dL (ref 13.0–17.0)
MCH: 30.5 pg (ref 26.0–34.0)
MCHC: 33.8 g/dL (ref 30.0–36.0)
MCV: 90.2 fL (ref 80.0–100.0)
Platelets: 202 10*3/uL (ref 150–400)
RBC: 5.31 MIL/uL (ref 4.22–5.81)
RDW: 12.5 % (ref 11.5–15.5)
WBC: 6.3 10*3/uL (ref 4.0–10.5)
nRBC: 0 % (ref 0.0–0.2)

## 2020-07-14 LAB — TROPONIN I (HIGH SENSITIVITY)
Troponin I (High Sensitivity): 21 ng/L — ABNORMAL HIGH (ref <18)
Troponin I (High Sensitivity): 21 ng/L — ABNORMAL HIGH (ref <18)

## 2020-07-14 MED ORDER — AMLODIPINE BESYLATE 5 MG PO TABS
10.0000 mg | ORAL_TABLET | Freq: Once | ORAL | Status: AC
  Administered 2020-07-14: 10 mg via ORAL
  Filled 2020-07-14: qty 2

## 2020-07-14 MED ORDER — CLONIDINE HCL 0.2 MG PO TABS
0.2000 mg | ORAL_TABLET | Freq: Once | ORAL | Status: AC
  Administered 2020-07-14: 0.2 mg via ORAL
  Filled 2020-07-14: qty 1

## 2020-07-14 MED ORDER — NITROGLYCERIN IN D5W 200-5 MCG/ML-% IV SOLN: 0.0000 ug/min | INTRAVENOUS | Status: DC
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
## 2020-07-14 NOTE — ED Notes (Signed)
Attempted IV 2x. Unsuccessful.

## 2020-07-14 NOTE — ED Notes (Signed)
Patient transported to CT 

## 2020-07-14 NOTE — ED Provider Notes (Signed)
Emergency Medicine Provider Triage Evaluation Note  Eduardo Weeks , a 60 y.o. male  was evaluated in triage.  Pt complains of HTN.  Review of Systems  Positive: Headache, pressure behind eyes, woozy Negative: Cp, sob, abd pain, focal numbness or focal weakness  Physical Exam  BP (!) 189/132 (BP Location: Right Arm)   Pulse 78   Temp 98 F (36.7 C) (Oral)   Resp 18   SpO2 100%  Gen:   Awake, no distress   Resp:  Normal effort  MSK:   Moves extremities without difficulty  Other:    Medical Decision Making  Medically screening exam initiated at 3:16 PM.  Appropriate orders placed.  Eduardo Weeks was informed that the remainder of the evaluation will be completed by another provider, this initial triage assessment does not replace that evaluation, and the importance of remaining in the ED until their evaluation is complete.  Hx of HTN on metoprolol and lisinopril.  Noticed BP >200 for the past week despite being compliant with medications.  No dietary changes.  Endorse pressure behind eyes and feeling a bit woozy with positional change on occasion.  No cp/sob   Fayrene Helper, PA-C 07/14/20 1518    Milagros Loll, MD 07/15/20 [id number]

## 2020-07-14 NOTE — ED Triage Notes (Signed)
Pt reports hypertension with hx of same. Took extra dose of lisinopril at direction of physician without change. Endorses headache and a little dizziness when standing.

## 2020-07-15 ENCOUNTER — Inpatient Hospital Stay (HOSPITAL_COMMUNITY): Payer: Medicaid Other

## 2020-07-15 DIAGNOSIS — I6389 Other cerebral infarction: Secondary | ICD-10-CM

## 2020-07-15 DIAGNOSIS — F1410 Cocaine abuse, uncomplicated: Secondary | ICD-10-CM | POA: Diagnosis present

## 2020-07-15 DIAGNOSIS — I161 Hypertensive emergency: Secondary | ICD-10-CM | POA: Diagnosis present

## 2020-07-15 DIAGNOSIS — N183 Chronic kidney disease, stage 3 unspecified: Secondary | ICD-10-CM | POA: Diagnosis present

## 2020-07-15 DIAGNOSIS — E785 Hyperlipidemia, unspecified: Secondary | ICD-10-CM | POA: Diagnosis present

## 2020-07-15 DIAGNOSIS — Z794 Long term (current) use of insulin: Secondary | ICD-10-CM

## 2020-07-15 DIAGNOSIS — I160 Hypertensive urgency: Secondary | ICD-10-CM

## 2020-07-15 DIAGNOSIS — E119 Type 2 diabetes mellitus without complications: Secondary | ICD-10-CM

## 2020-07-15 DIAGNOSIS — N1831 Chronic kidney disease, stage 3a: Secondary | ICD-10-CM

## 2020-07-15 LAB — COMPREHENSIVE METABOLIC PANEL
ALT: 17 U/L (ref 0–44)
AST: 19 U/L (ref 15–41)
Albumin: 2.9 g/dL — ABNORMAL LOW (ref 3.5–5.0)
Alkaline Phosphatase: 39 U/L (ref 38–126)
Anion gap: 7 (ref 5–15)
BUN: 16 mg/dL (ref 6–20)
CO2: 25 mmol/L (ref 22–32)
Calcium: 9.1 mg/dL (ref 8.9–10.3)
Chloride: 102 mmol/L (ref 98–111)
Creatinine, Ser: 1.63 mg/dL — ABNORMAL HIGH (ref 0.61–1.24)
GFR, Estimated: 48 mL/min — ABNORMAL LOW (ref >60)
Glucose, Bld: 233 mg/dL — ABNORMAL HIGH (ref 70–99)
Potassium: 3.8 mmol/L (ref 3.5–5.1)
Sodium: 134 mmol/L — ABNORMAL LOW (ref 135–145)
Total Bilirubin: 0.4 mg/dL (ref 0.3–1.2)
Total Protein: 5.6 g/dL — ABNORMAL LOW (ref 6.5–8.1)

## 2020-07-15 LAB — HIV ANTIBODY (ROUTINE TESTING W REFLEX): HIV Screen 4th Generation wRfx: NONREACTIVE

## 2020-07-15 LAB — RESP PANEL BY RT-PCR (FLU A&B, COVID) ARPGX2
Influenza A by PCR: NEGATIVE
Influenza B by PCR: NEGATIVE
SARS Coronavirus 2 by RT PCR: NEGATIVE

## 2020-07-15 LAB — ECHOCARDIOGRAM COMPLETE
AR max vel: 2.7 cm2
AV Area VTI: 2.56 cm2
AV Area mean vel: 2.46 cm2
AV Mean grad: 2 mmHg
AV Peak grad: 4.6 mmHg
Ao pk vel: 1.07 m/s
Area-P 1/2: 4.06 cm2
Calc EF: 40.8 %
S' Lateral: 2.7 cm
Single Plane A2C EF: 41.4 %
Single Plane A4C EF: 41.2 %

## 2020-07-15 LAB — GLUCOSE, CAPILLARY
Glucose-Capillary: 239 mg/dL — ABNORMAL HIGH (ref 70–99)
Glucose-Capillary: 205 mg/dL — ABNORMAL HIGH (ref 70–99)
Glucose-Capillary: 196 mg/dL — ABNORMAL HIGH (ref 70–99)
Glucose-Capillary: 290 mg/dL — ABNORMAL HIGH (ref 70–99)
Glucose-Capillary: 229 mg/dL — ABNORMAL HIGH (ref 70–99)

## 2020-07-15 LAB — LIPID PANEL
Cholesterol: 139 mg/dL (ref 0–200)
HDL: 42 mg/dL (ref >40)
LDL Cholesterol: 80 mg/dL (ref 0–99)
Total CHOL/HDL Ratio: 3.3 RATIO
Triglycerides: 87 mg/dL (ref <150)
VLDL: 17 mg/dL (ref 0–40)

## 2020-07-15 LAB — RAPID URINE DRUG SCREEN, HOSP PERFORMED
Amphetamines: NOT DETECTED
Barbiturates: NOT DETECTED
Benzodiazepines: NOT DETECTED
Cocaine: NOT DETECTED
Opiates: NOT DETECTED
Tetrahydrocannabinol: NOT DETECTED

## 2020-07-15 LAB — CBC
HCT: 43.6 % (ref 39.0–52.0)
Hemoglobin: 14.9 g/dL (ref 13.0–17.0)
MCH: 30.5 pg (ref 26.0–34.0)
MCHC: 34.2 g/dL (ref 30.0–36.0)
MCV: 89.2 fL (ref 80.0–100.0)
Platelets: 181 10*3/uL (ref 150–400)
RBC: 4.89 MIL/uL (ref 4.22–5.81)
RDW: 12.5 % (ref 11.5–15.5)
WBC: 7.6 10*3/uL (ref 4.0–10.5)
nRBC: 0 % (ref 0.0–0.2)

## 2020-07-15 MED ORDER — ONDANSETRON HCL 4 MG/2ML IJ SOLN: 4.0000 mg | Freq: Four times a day (QID) | INTRAMUSCULAR | Status: DC | PRN

## 2020-07-15 MED ORDER — PERFLUTREN LIPID MICROSPHERE
1.0000 mL | INTRAVENOUS | Status: AC | PRN
  Administered 2020-07-15: 5 mL via INTRAVENOUS
  Filled 2020-07-15: qty 10

## 2020-07-15 MED ORDER — IBUPROFEN 200 MG PO TABS
800.0000 mg | ORAL_TABLET | Freq: Four times a day (QID) | ORAL | Status: DC | PRN
  Administered 2020-07-15 (×3): 800 mg via ORAL
  Filled 2020-07-15 (×3): qty 4

## 2020-07-15 MED ORDER — GABAPENTIN 400 MG PO CAPS
800.0000 mg | ORAL_CAPSULE | Freq: Three times a day (TID) | ORAL | Status: DC
  Administered 2020-07-15 – 2020-07-16 (×6): 800 mg via ORAL
  Filled 2020-07-15 (×8): qty 2

## 2020-07-15 MED ORDER — DOXEPIN HCL 10 MG PO CAPS
10.0000 mg | ORAL_CAPSULE | Freq: Every day | ORAL | Status: DC
  Filled 2020-07-15 (×2): qty 1

## 2020-07-15 MED ORDER — LORATADINE 10 MG PO TABS
10.0000 mg | ORAL_TABLET | Freq: Every day | ORAL | Status: DC
  Administered 2020-07-15 – 2020-07-16 (×2): 10 mg via ORAL
  Filled 2020-07-15 (×2): qty 1

## 2020-07-15 MED ORDER — INSULIN GLARGINE 100 UNIT/ML ~~LOC~~ SOLN
50.0000 [IU] | Freq: Every day | SUBCUTANEOUS | Status: DC
  Filled 2020-07-15 (×4): qty 0.5

## 2020-07-15 MED ORDER — INSULIN ASPART 100 UNIT/ML IJ SOLN
0.0000 [IU] | Freq: Three times a day (TID) | INTRAMUSCULAR | Status: DC
  Administered 2020-07-15 (×2): 5 [IU] via SUBCUTANEOUS
  Administered 2020-07-15: 8 [IU] via SUBCUTANEOUS
  Administered 2020-07-15: 3 [IU] via SUBCUTANEOUS
  Administered 2020-07-16: 8 [IU] via SUBCUTANEOUS

## 2020-07-15 MED ORDER — METOPROLOL SUCCINATE ER 50 MG PO TB24
50.0000 mg | ORAL_TABLET | Freq: Every day | ORAL | Status: DC
  Administered 2020-07-15 – 2020-07-16 (×2): 50 mg via ORAL
  Filled 2020-07-15 (×2): qty 1

## 2020-07-15 MED ORDER — ENOXAPARIN SODIUM 40 MG/0.4ML IJ SOSY
40.0000 mg | PREFILLED_SYRINGE | Freq: Every day | INTRAMUSCULAR | Status: DC
  Administered 2020-07-15: 40 mg via SUBCUTANEOUS
  Filled 2020-07-15: qty 0.4

## 2020-07-15 MED ORDER — PANTOPRAZOLE SODIUM 40 MG PO TBEC
40.0000 mg | DELAYED_RELEASE_TABLET | Freq: Every day | ORAL | Status: DC
  Administered 2020-07-15 – 2020-07-16 (×2): 40 mg via ORAL
  Filled 2020-07-15 (×2): qty 1

## 2020-07-15 MED ORDER — LISINOPRIL 10 MG PO TABS
10.0000 mg | ORAL_TABLET | Freq: Every day | ORAL | Status: DC
  Administered 2020-07-15 – 2020-07-16 (×2): 10 mg via ORAL
  Filled 2020-07-15 (×2): qty 1

## 2020-07-15 MED ORDER — ONDANSETRON HCL 4 MG PO TABS: 4.0000 mg | ORAL_TABLET | Freq: Four times a day (QID) | ORAL | Status: DC | PRN

## 2020-07-15 MED ORDER — ASPIRIN EC 81 MG PO TBEC
81.0000 mg | DELAYED_RELEASE_TABLET | Freq: Every day | ORAL | Status: DC
  Administered 2020-07-15 – 2020-07-16 (×2): 81 mg via ORAL
  Filled 2020-07-15 (×2): qty 1

## 2020-07-15 MED ORDER — CLOPIDOGREL BISULFATE 75 MG PO TABS
75.0000 mg | ORAL_TABLET | Freq: Every day | ORAL | Status: DC
  Administered 2020-07-15 – 2020-07-16 (×2): 75 mg via ORAL
  Filled 2020-07-15 (×2): qty 1

## 2020-07-15 MED ORDER — PRAVASTATIN SODIUM 40 MG PO TABS
40.0000 mg | ORAL_TABLET | Freq: Every day | ORAL | Status: DC
  Administered 2020-07-15: 40 mg via ORAL
  Filled 2020-07-15: qty 1

## 2020-07-15 MED ORDER — NITROGLYCERIN IN D5W 200-5 MCG/ML-% IV SOLN
0.0000 ug/min | INTRAVENOUS | Status: DC
  Administered 2020-07-15: 5 ug/min via INTRAVENOUS
  Filled 2020-07-15: qty 250

## 2020-07-15 MED ORDER — NICOTINE 21 MG/24HR TD PT24
21.0000 mg | MEDICATED_PATCH | Freq: Every day | TRANSDERMAL | Status: DC
  Administered 2020-07-16: 21 mg via TRANSDERMAL
  Filled 2020-07-15 (×2): qty 1

## 2020-07-15 NOTE — Plan of Care (Signed)
  Problem: Education: Goal: Knowledge of General Education information will improve Description Including pain rating scale, medication(s)/side effects and non-pharmacologic comfort measures Outcome: Progressing   

## 2020-07-15 NOTE — Progress Notes (Signed)
Same day note  Patient seen and examined at bedside.  Patient was admitted to the hospital for high blood pressure and headaches.  At the time of my evaluation, patient complains of no headache, dizziness, lightheadedness or shortness of breath.  Physical examination reveals normal physical exam.  Laboratory data and imaging was reviewed  Assessment and Plan.  Hypertensive emergency with stroke. Patient was initially started on nitroglycerin drip.  Home regimen has been resumed with metoprolol and lisinopril.  Continue to monitor closely.  Acute to subacute CVA: MRI showed punctuate focus of abnormal diffusion in the paramedian right frontal lobe consistent with acute infarct..  2D echocardiogram was performed which showed LV ejection fraction of 55 to 60% with severe LVH.   Patient has remote history of cocaine usage.  Has remote history of cocaine abuse.  Urine drug screen negative at this time.  We will continue with aspirin and statins.  Hemoglobin A1c lipid profile noted with LDL of 80.  Follow neurology recommendations.  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  Hyperlipidemia: Continue with statin  Chronic kidney disease stage IIIa,  likely secondary to diabetes: We will continue to monitor.  Diabetes mellitus:  On metformin, Lantus in NovoLog at home.  We will continue with Lantus and NovoLog sliding scale while in the hospital.  Hemoglobin A1c pending  No Charge  Signed,  Tenny Craw, MD [facility]

## 2020-07-15 NOTE — ED Notes (Signed)
Neurology at bedside.

## 2020-07-15 NOTE — Progress Notes (Signed)
Carotid duplex completed.  Chevez Sambrano, BS, RDMS, RVT  

## 2020-07-15 NOTE — Progress Notes (Signed)
SLP Cancellation Note  Patient Details Name: Roni Scow MRN: [id number] DOB: 11-Dec-1960   Cancelled treatment:       Reason Eval/Treat Not Completed: Other (comment). Patient passed [place] and is consuming pos without difficulty per RN. Will defer formal evaluation.   Ferdinand Lango MA, CCC-SLP     Mahira Petras Meryl 07/15/2020, 9:24 AM
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
## 2020-07-15 NOTE — Progress Notes (Addendum)
STROKE TEAM PROGRESS NOTE   ATTENDING NOTE: I reviewed above note and agree with the assessment and plan. Pt was seen and examined.   60 year old male with history of hypertension hyperlipidemia, diabetes, smoker, CKD, previous substance and alcohol abuse admitted for elevated BP, headache and lightheadedness.  CT no acute abnormality.  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  MRA and carotid Doppler unremarkable.  EF 55 to 60%.  Creatinine 1.63.  LDL 88, A1c pending, UDS negative.  On exam, patient neurologically intact, no focal deficit.  Etiology for patient symptoms most likely due to hypertensive encephalopathy/urgency, currently BP stabilized.  Etiology for patient stroke likely incidental finding, due to small vessel disease given multiple uncontrolled risk factors.  Recommend aspirin 81 and Plavix 75 DAPT for 3 weeks and then Plavix alone.  Continue pravastatin 40 on discharge, smoking cessation education provided.  BP stable now aggressive risk factor modification.  PT/OT no recommendations.  For detailed assessment and plan, please refer to below as I have made changes wherever appropriate.   Neurology will sign off. Please call with questions. Pt will follow up with stroke clinic NP at [facility] in about 4 weeks. Thanks for the consult.   Marvel Plan, MD PhD Stroke Neurology 07/15/2020 5:25 PM     INTERVAL HISTORY VSS. No acute events. Neuro exam stable.  Today Mr. Seelman is sitting up in bed talking on his cell phone. He reports feeling much better than yesterday. His dizziness and headache have resolved. He reports he has cut his smoking way back to around a cigarette daily or qod. He is willing to continue to try to quit. He thinks he has become a bit lax about his health conditions and understands he needs to give them more attention. He lost track of taking preventive ASA for awhile and then recently restarted it. We discussed his stroke diagnosis, risk factor control,  ongoing work up and plan of care. His questions were addressed.   Vitals:   07/15/20 0145 07/15/20 0200 07/15/20 0219 07/15/20 0731  BP: (!) 143/87 132/71 131/78 (!) 136/98  Pulse: 86 65 63 62  Resp:   17 18  Temp:   (!) 97.5 F (36.4 C) 97.8 F (36.6 C)  TempSrc:   Oral Oral  SpO2: 100% 95% 100% 96%   CBC:  Recent Labs  Lab 07/14/20 1517 07/15/20 0330  WBC 6.3 7.6  HGB 16.2 14.9  HCT 47.9 43.6  MCV 90.2 89.2  PLT 202 181   Basic Metabolic Panel:  Recent Labs  Lab 07/14/20 1517 07/15/20 0330  NA 138 134*  K 4.3 3.8  CL 108 102  CO2 25 25  GLUCOSE 101* 233*  BUN 15 16  CREATININE 1.79* 1.63*  CALCIUM 9.5 9.1   Lipid Panel:  Recent Labs  Lab 07/15/20 0330  CHOL 139  TRIG 87  HDL 42  CHOLHDL 3.3  VLDL 17  LDLCALC 80   HgbA1c: No results for input(s): HGBA1C in the last 168 hours. Urine Drug Screen: No results for input(s): LABOPIA, COCAINSCRNUR, LABBENZ, AMPHETMU, THCU, LABBARB in the last 168 hours.  Alcohol Level No results for input(s): ETH in the last 168 hours.  IMAGING past 24 hours DG Chest 2 View  Result Date: 07/14/2020 CLINICAL DATA:  Hypertension. EXAM: CHEST - 2 VIEW COMPARISON:  Jun 22, 2017. FINDINGS: The heart size and mediastinal contours are within normal limits. Both lungs are clear. The visualized skeletal structures are unremarkable. IMPRESSION: No active cardiopulmonary disease. Electronically Signed  By: Lupita Raider M.D.   On: 07/14/2020 16:06   CT Head Wo Contrast  Result Date: 07/14/2020 CLINICAL DATA:  Worsening headache with hypertension EXAM: CT HEAD WITHOUT CONTRAST TECHNIQUE: Contiguous axial images were obtained from the base of the skull through the vertex without intravenous contrast. COMPARISON:  CT brain 02/26/2017 FINDINGS: Brain: No hemorrhage or intracranial mass is visualized. Interval mild patchy white matter hypodensity likely chronic small vessel ischemic change. Possible acute or subacute infarct within the right  basal ganglia and white matter. Ventricles are nonenlarged. Vascular: No hyperdense vessels.  Carotid vascular calcification Skull: Normal. Negative for fracture or focal lesion. Sinuses/Orbits: Mucosal thickening in the sinuses. Old fracture medial wall left orbit Other: None IMPRESSION: 1. Possible acute to subacute lacunar infarct within the right basal ganglia and white matter. Further evaluation with MRI may be considered. 2. Negative for acute intracranial hemorrhage 3. Other patchy white matter hypodensity likely chronic small vessel ischemic change Electronically Signed   By: Jasmine Pang M.D.   On: 07/14/2020 20:07   MR Brain Wo Contrast (neuro protocol)  Result Date: 07/14/2020 CLINICAL DATA:  Neuro deficit, acute, stroke suspected EXAM: MRI HEAD WITHOUT CONTRAST TECHNIQUE: Multiplanar, multiecho pulse sequences of the brain and surrounding structures were obtained without intravenous contrast. COMPARISON:  None. FINDINGS: Brain: Punctate focus of abnormal diffusion restriction within the paramedian right frontal lobe (series 5, image 85). No other diffusion abnormality. No acute or chronic hemorrhage. Hyperintense T2-weighted signal is moderately widespread throughout the white matter. Parenchymal volume and CSF spaces are normal. Hyperintense T2-weighted signal also within the brainstem. The midline structures are normal. Vascular: Major flow voids are preserved. Skull and upper cervical spine: Normal calvarium and skull base. Visualized upper cervical spine and soft tissues are normal. Sinuses/Orbits:Mild right maxillary sinus mucosal thickening. Left maxillary sinus atelectasis. Normal orbits. IMPRESSION: 1. Punctate focus of abnormal diffusion restriction within the paramedian right frontal lobe, consistent with small acute infarct. No hemorrhage or mass effect. 2. Moderate chronic small vessel ischemic disease. Electronically Signed   By: Deatra Robinson M.D.   On: 07/14/2020 22:50   VAS US  CAROTID  Result Date: 07/15/2020 Carotid Arterial Duplex Study Patient Name:  JAKIN PAVAO  Date of Exam:   07/15/2020 Medical Rec #: [id number]           Accession #:    [id number] Date of Birth: 01-Nov-1960          Patient Gender: M Patient Age:   59Y Exam Location:  [facility] Procedure:      VAS US CAROTID Referring Phys: [id number] [facility] POKHREL --------------------------------------------------------------------------------  Indications:       Carotid artery disease. Risk Factors:      Diabetes. Other Factors:     CT scan: Possible acute to subacute lacunar infarct within                    the right basal                    ganglia and white matter. Comparison Study:  none Performing Technologist: Jeb Levering RDMS, RVT  Examination Guidelines: A complete evaluation includes B-mode imaging, spectral Doppler, color Doppler, and power Doppler as needed of all accessible portions of each vessel. Bilateral testing is considered an integral part of a complete examination. Limited examinations for reoccurring indications may be performed as noted.  Right Carotid Findings: +----------+--------+--------+--------+------------------+--------+           PSV  cm/sEDV cm/sStenosisPlaque DescriptionComments +----------+--------+--------+--------+------------------+--------+ CCA Prox  86      12                                         +----------+--------+--------+--------+------------------+--------+ CCA Distal53      11                                         +----------+--------+--------+--------+------------------+--------+ ICA Prox  50      13      1-39%                              +----------+--------+--------+--------+------------------+--------+ ICA Distal49      15                                         +----------+--------+--------+--------+------------------+--------+ ECA       86      17                                          +----------+--------+--------+--------+------------------+--------+ +----------+--------+-------+----------------+-------------------+           PSV cm/sEDV cmsDescribe        Arm Pressure (mmHG) +----------+--------+-------+----------------+-------------------+ [id number]            Multiphasic, WNL                    +----------+--------+-------+----------------+-------------------+ +---------+--------+--+--------+-+---------+ VertebralPSV cm/s40EDV cm/s6Antegrade +---------+--------+--+--------+-+---------+  Left Carotid Findings: +----------+--------+--------+--------+------------------+--------+           PSV cm/sEDV cm/sStenosisPlaque DescriptionComments +----------+--------+--------+--------+------------------+--------+ CCA Prox  69      10                                         +----------+--------+--------+--------+------------------+--------+ CCA Distal55      11                                         +----------+--------+--------+--------+------------------+--------+ ICA Prox  41      12      1-39%                              +----------+--------+--------+--------+------------------+--------+ ICA Distal71      28                                         +----------+--------+--------+--------+------------------+--------+ ECA       60      12                                         +----------+--------+--------+--------+------------------+--------+ +----------+--------+--------+----------------+-------------------+  PSV cm/sEDV cm/sDescribe        Arm Pressure (mmHG) +----------+--------+--------+----------------+-------------------+ Subclavian117             Multiphasic, WNL                    +----------+--------+--------+----------------+-------------------+ +---------+--------+--+--------+-+---------+ VertebralPSV cm/s31EDV cm/s8Antegrade +---------+--------+--+--------+-+---------+   Summary: Right Carotid:  Velocities in the right ICA are consistent with a 1-39% stenosis. Left Carotid: Velocities in the left ICA are consistent with a 1-39% stenosis.  *See table(s) above for measurements and observations.     Preliminary    PHYSICAL EXAM Gen: Well developed middle aged male sitting up in bed in NAD HEENT: [place]/AT Resp: No extra work of breathing CV: RRR showing on tele  Ext: No edema  Neurologic Examination: Mental Status: Awake, alert and oriented x4. Speech clear and fluent.  Cranial Nerves: II:  Temporal visual fields intact with no extinction to DSS. PERRL. III,IV, VI: No ptosis. EOMI. No nystagmus.  V,VII: Smile symmetric, facial temp sensation equal bilaterally VIII: hearing intact to conversation IX,X: No hypophonia or hoarseness XI: Head is midline.  XII: midline tongue extension   Motor: Motor Strength - The patient's strength was normal in all extremities and pronator drift was absent.  Bulk was normal and fasciculations were absent. Motor Tone - Muscle tone was assessed at the neck and appendages and was normal.  Sensory - Light touch was assessed and was symmetrical x 4 extremities.    Coordination - The patient had normal movements in the hands and feet with no ataxia or dysmetria.   Gait and Station - deferred.  ASSESSMENT/PLAN Travor Royce is an 60 y.o. male with a history of HTN (on metoprolol and lisinopril), hepatitis C, DM, tobacco use, HLD, remote history of cocaine use and CKD III who presented to the ED on Wednesday afternoon with a chief complaint of severe HTN based on BP readings he had been taking at home. BP was consistently > 200 using his wrist cuff BP monitor. Primary symptom was a retroorbital pressure headache with photophobia, which he felt may have been due to the HTN. He also had some dizziness when standing. EKG showed signs of LVH. He had elevated but flat troponins.   Small paramedian right frontal lobe stroke likely incidentally found in the  setting of hypertensive emergency, most likely small vessel disease   CT head in the ED showed a possible subacute stroke.   MRI revealed a punctate acute stroke in the paramedian right frontal lobe.  MRA  Pending  Carotid Doppler  Pending  2D Echo EF 55-60%, left ventricle regional wall motion abnormalities. Severe concentric LVH. Hypokinesis of LV, basal inferior wall and inferolateral wall. Mild to moderate MVR. No shunt or thrombus seen.    LDL 80  HgbA1c Pending as a sendout  UDS neg  VTE prophylaxis - Lovenox PPX  On ASA  PTA, now on DAPT with ASA  and Plavix 75 mg x 3 weeks then plavix monotherapy.   Smoking cessation: on nicotine patch   Therapy recommendations:  Cleared for home   Disposition:  Home   Hypertension  Home meds:  Lisinopril , Toprol XL  daily  Initially required nitroglycerin drip. Stable currently.  Marland Kitchen Permissive hypertension (OK if < 220/120) but gradually normalize in 5-7 days . Long-term BP goal normotensive  . Management per primary team  Hyperlipidemia  Home meds: Mevacor  daily, resumed in hospital  LDL 80, almost at goal < 70  Pravachol   on board  High intensity statin:  Not indicated as patient is almost at goal  Continue statin at discharge  Diabetes type II Uncontrolled  HgbA1c pending as sendout but recently checked on 05/31/20 with 8.3 result, not at  goal < 7.0  CBGs Recent Labs    07/14/20 1718 07/15/20 0228 07/15/20 0547  GLUCAP 116* 239* 205*      SSI  Tobacco abuse  Current smoker  Smoking cessation counseling provided  Nicotine patch provided  Pt is willing to quit  Other Stroke Risk Factors  Remote hx of substance abuse - cocaine quit 6 years ago  Remote hx of alcohol abuse - quit 6 years ago  Other Active Problems    Hospital day # 1  Delila A Bailey-Modzik, NP-C   To contact Stroke Continuity provider, please refer to [email or web address]. After hours, contact General  Neurology

## 2020-07-15 NOTE — Evaluation (Signed)
Physical Therapy Evaluation & Discharge Patient Details Name: Eduardo Weeks MRN: [id number] DOB: 1960/12/08 Today's Date: 07/15/2020   History of Present Illness  60 y/o male presented to ED on 6/1 with concern for high BP and intermittent headaches. MRI found punctate focus of abnormal diffusion restriction within paramedian R frontal lobe, consistent with small acute infarct. PMH: HTN, hepatitis C, DM, HLD, tobacco use, CKD III  Clinical Impression  PTA, patient lives alone in 2nd floor apartment and reports independence and mild balance deficits at baseline with turning around but no falls in past 3 months. Patient currently functioning at independent level for mobility. Patient scored 21/24 on DGI with difficulty during pivot and turn and use of rail on stairs. No further skilled PT needs required acutely. No PT follow up recommended at this time. Patient reports being at baseline functioning and does not require follow up PT.    Follow Up Recommendations No PT follow up    Equipment Recommendations  None recommended by PT    Recommendations for Other Services       Precautions / Restrictions Precautions Precautions: Fall Restrictions Weight Bearing Restrictions: No      Mobility  Bed Mobility Overal bed mobility: Independent                  Transfers Overall transfer level: Independent                  Ambulation/Gait Ambulation/Gait assistance: Independent Gait Distance (Feet): 200 Feet Assistive device: None Gait Pattern/deviations: WFL(Within Functional Limits)   Gait velocity interpretation: >4.37 ft/sec, indicative of normal walking speed    Stairs Stairs: Yes Stairs assistance: Independent Stair Management: One rail Left;Alternating pattern;Forwards Number of Stairs: 10    Wheelchair Mobility    Modified Rankin (Stroke Patients Only) Modified Rankin (Stroke Patients Only) Pre-Morbid Rankin Score: No symptoms Modified Rankin: No  symptoms     Balance Overall balance assessment: Mild deficits observed, not formally tested                               Standardized Balance Assessment Standardized Balance Assessment : Dynamic Gait Index   Dynamic Gait Index Level Surface: Normal Change in Gait Speed: Normal Gait with Horizontal Head Turns: Normal Gait with Vertical Head Turns: Normal Gait and Pivot Turn: Moderate Impairment Step Over Obstacle: Normal Step Around Obstacles: Normal Steps: Mild Impairment Total Score: 21       Pertinent Vitals/Pain Pain Assessment: No/denies pain    Home Living Family/patient expects to be discharged to:: Private residence Living Arrangements: Alone   Type of Home: Apartment Home Access: Stairs to enter Entrance Stairs-Rails: Left Entrance Stairs-Number of Steps: flight Home Layout: Two level Home Equipment: None      Prior Function Level of Independence: Independent         Comments: does not drive     Hand Dominance        Extremity/Trunk Assessment   Upper Extremity Assessment Upper Extremity Assessment: Overall WFL for tasks assessed    Lower Extremity Assessment Lower Extremity Assessment: Overall WFL for tasks assessed    Cervical / Trunk Assessment Cervical / Trunk Assessment: Normal  Communication   Communication: No difficulties  Cognition Arousal/Alertness: Awake/alert Behavior During Therapy: WFL for tasks assessed/performed Overall Cognitive Status: Within Functional Limits for tasks assessed  General Comments      Exercises     Assessment/Plan    PT Assessment Patent does not need any further PT services  PT Problem List         PT Treatment Interventions      PT Goals (Current goals can be found in the Care Plan section)  Acute Rehab PT Goals Patient Stated Goal: to go home PT Goal Formulation: All assessment and education complete, DC therapy     Frequency     Barriers to discharge        Co-evaluation               AM-PAC PT "6 Clicks" Mobility  Outcome Measure Help needed turning from your back to your side while in a flat bed without using bedrails?: None Help needed moving from lying on your back to sitting on the side of a flat bed without using bedrails?: None Help needed moving to and from a bed to a chair (including a wheelchair)?: None Help needed standing up from a chair using your arms (e.g., wheelchair or bedside chair)?: None Help needed to walk in hospital room?: None Help needed climbing 3-5 steps with a railing? : None 6 Click Score: 24    End of Session   Activity Tolerance: Patient tolerated treatment well Patient left: in bed;with call bell/phone within reach;with bed alarm set Nurse Communication: Mobility status PT Visit Diagnosis: Unsteadiness on feet (R26.81)    Time: 4132-4401 PT Time Calculation (min) (ACUTE ONLY): 19 min   Charges:   PT Evaluation $PT Eval Low Complexity: 1 Low          Anisah Kuck A. Dan Humphreys PT, DPT Acute Rehabilitation Services Pager [phone] Office [phone]   Viviann Spare 07/15/2020, 10:50 AM

## 2020-07-15 NOTE — Plan of Care (Signed)
  Problem: Education: Goal: Knowledge of General Education information will improve Description: Including pain rating scale, medication(s)/side effects and non-pharmacologic comfort measures 07/15/2020 1607 by Genevie Ann, RN Outcome: Progressing 07/15/2020 1606 by Genevie Ann, RN Outcome: Progressing

## 2020-07-16 DIAGNOSIS — F1410 Cocaine abuse, uncomplicated: Secondary | ICD-10-CM

## 2020-07-16 LAB — HEMOGLOBIN A1C
Hgb A1c MFr Bld: 8.5 % — ABNORMAL HIGH (ref 4.8–5.6)
Mean Plasma Glucose: 197 mg/dL

## 2020-07-16 LAB — GLUCOSE, CAPILLARY: Glucose-Capillary: 256 mg/dL — ABNORMAL HIGH (ref 70–99)

## 2020-07-16 MED ORDER — CLOPIDOGREL BISULFATE 75 MG PO TABS: 75.0000 mg | ORAL_TABLET | Freq: Every day | ORAL | 3 refills | Status: DC

## 2020-07-16 MED ORDER — ASPIRIN LOW DOSE 81 MG PO TBEC: 81.0000 mg | DELAYED_RELEASE_TABLET | Freq: Every day | ORAL | Status: AC

## 2020-07-16 MED ORDER — LOVASTATIN 40 MG PO TABS: 40.0000 mg | ORAL_TABLET | Freq: Every day | ORAL | 2 refills | Status: DC

## 2020-07-16 NOTE — Progress Notes (Signed)
Inpatient Diabetes Program Recommendations  AACE/ADA: New Consensus Statement on Inpatient Glycemic Control (2015)  Target Ranges:  Prepandial:   less than 140 mg/dL      Peak postprandial:   less than 180 mg/dL (1-2 hours)      Critically ill patients:  140 - 180 mg/dL   Lab Results  Component Value Date   GLUCAP 256 (H) 07/16/2020   HGBA1C 8.5 (H) 07/15/2020    Review of Glycemic Control Results for Eduardo Weeks, Eduardo Weeks (MRN [id number]) as of 07/16/2020 10:19  Ref. Range 07/15/2020 11:47 07/15/2020 17:07  Glucose-Capillary Latest Ref Range: 70 - 99 mg/dL 537 (H) 482 (H)   Diabetes history: Type 2 Dm Outpatient Diabetes medications: Lantus 50 units QHS, novolog Novolog 0-20 units TID, Metformin 500 mg BID Current orders for Inpatient glycemic control: Lantus 50 units QHS, Novolog 0-15 units TID & HS  Inpatient Diabetes Program Recommendations:    Noted glucose elevations this AM, attributing to patient refusal of lantus. If patient uncomfortable with dose could reduce to Lantus 25 units QHs.   Thanks, Lujean Rave, MSN, RNC-OB Diabetes Coordinator [phone] (8a-5p)

## 2020-07-16 NOTE — Evaluation (Signed)
Occupational Therapy Evaluation Patient Details Name: Eduardo Weeks MRN: [id number] DOB: 05-Feb-1961 Today's Date: 07/16/2020    History of Present Illness 60 y/o male presented to ED on 6/1 with concern for high BP and intermittent headaches. MRI found punctate focus of abnormal diffusion restriction within paramedian R frontal lobe, consistent with small acute infarct. PMH: HTN, hepatitis C, DM, HLD, tobacco use, CKD III   Clinical Impression   Patient admitted with the diagnosis above.  PTA he lives alone and was independent with all care and mobility.  He does not drive.  Mild unsteadiness noted with mobility, but essentially is at his baseline for ADL completion and in room mobility.  No further needs in the acute setting.       Follow Up Recommendations  No OT follow up    Equipment Recommendations  None recommended by OT    Recommendations for Other Services       Precautions / Restrictions Precautions Precautions: Fall      Mobility Bed Mobility Overal bed mobility: Independent                  Transfers Overall transfer level: Independent                    Balance Overall balance assessment: Mild deficits observed, not formally tested                                         ADL either performed or assessed with clinical judgement   ADL Overall ADL's : At baseline                                             Vision Patient Visual Report: No change from baseline       Perception     Praxis      Pertinent Vitals/Pain Pain Assessment: No/denies pain     Hand Dominance Right   Extremity/Trunk Assessment Upper Extremity Assessment Upper Extremity Assessment: Overall WFL for tasks assessed   Lower Extremity Assessment Lower Extremity Assessment: Defer to PT evaluation   Cervical / Trunk Assessment Cervical / Trunk Assessment: Normal   Communication Communication Communication: No  difficulties   Cognition Arousal/Alertness: Awake/alert Behavior During Therapy: WFL for tasks assessed/performed Overall Cognitive Status: Within Functional Limits for tasks assessed                                                      Home Living Family/patient expects to be discharged to:: Private residence Living Arrangements: Alone   Type of Home: Apartment       Home Layout: One level     Bathroom Shower/Tub: Chief Strategy Officer: Standard     Home Equipment: None          Prior Functioning/Environment Level of Independence: Independent        Comments: does not drive        OT Problem List: Impaired balance (sitting and/or standing)      OT Treatment/Interventions:      OT Goals(Current goals can be found in the care plan section)  Acute Rehab OT Goals Patient Stated Goal: hoping to go home soon OT Goal Formulation: With patient Time For Goal Achievement: 07/16/20 Potential to Achieve Goals: Good  OT Frequency:     Barriers to D/C:  none noted          Co-evaluation              AM-PAC OT "6 Clicks" Daily Activity     Outcome Measure Help from another person eating meals?: None Help from another person taking care of personal grooming?: None Help from another person toileting, which includes using toliet, bedpan, or urinal?: None Help from another person bathing (including washing, rinsing, drying)?: None Help from another person to put on and taking off regular upper body clothing?: None Help from another person to put on and taking off regular lower body clothing?: None 6 Click Score: 24   End of Session Nurse Communication: Mobility status  Activity Tolerance: Patient tolerated treatment well Patient left: in chair  OT Visit Diagnosis: Unsteadiness on feet (R26.81)                Time: 9201-0071 OT Time Calculation (min): 11 min Charges:  OT General Charges $OT Visit: 1 Visit OT  Evaluation $OT Eval Moderate Complexity: 1 Mod  07/16/2020  Rich, OTR/L  Acute Rehabilitation Services  Office:  [phone]   Suzanna Obey 07/16/2020, 9:41 AM

## 2020-07-16 NOTE — Discharge Summary (Addendum)
Physician Discharge Summary  Eduardo MillinMichael Weeks ZOX:[id number] DOB: 21-Oct-1960 DOA: 07/14/2020  PCP: Primus BravoGibson, Tiffany, NP  Admit date: 07/14/2020 Discharge date: 07/16/2020  Admitted From: Home  Discharge disposition:  Home   Recommendations for Outpatient Follow-Up:   Follow up with your primary care provider in one week.  Check CBC, BMP, magnesium in the next visit [facility].  Internal referral has been made.   Discharge Diagnosis:   Principal Problem:   Hypertensive emergency Active Problems:   SubAcute CVA (cerebrovascular accident) (HCC)   Hyperlipemia   Diabetes (HCC)   CKD (chronic kidney disease), stage III (HCC)   Hx of Cocaine abuse (HCC)    Discharge Condition: Improved.  Diet recommendation: Low sodium, heart healthy.  Carbohydrate-modified.    Wound care: None.  Code status: Full.   History of Present Illness:   Eduardo MillinMichael Clason is a 60 y.o. male with medical history significant for essential hypertension, diabetes, hepatitis C, prior history of alcohol and cocaine use, ongoing tobacco abuse, hyperlipidemia and chronic kidney disease stage III presented to hospital with headache, weakness and elevated blood pressure.  His blood pressure was reported in the 200s.  Also had the dizziness associated with headache.  In the ED a CT scan was done which suggested possible CVA and MRI confirmed a small CVA.  Patient was then admitted to hospital for further evaluation and treatment.  Neurology was consulted.    Hospital Course:   Following conditions were addressed during hospitalization as listed below,  Hypertensive emergency with stroke. Patient was initially started on nitroglycerin drip.  Home regimen has been resumed with metoprolol and lisinopril.  Blood pressure has improved at this time.   Acute to subacute CVA:  MRI showed punctuate focus of abnormal diffusion in the paramedian right frontal lobe  consistent with acute infarct..  2D echocardiogram was performed which showed LV ejection fraction of 55 to 60% with severe LVH.   Patient has remote history of cocaine usage.   Urine drug screen negative at this time.  We will continue with aspirin and statins.  Hemoglobin A1c of 8.5.  Lipid profile noted with LDL of 80.   Patient was extensively counseled regarding lifestyle modification and adequate control of blood pressure and diabetes.  [facility]  Patient was seen by physical therapy and recommended no skilled therapy needs on discharge.  Tobacco abuse:  Continue nicotine patch, patient was encouraged to use it.   history of alcohol and cocaine abuse:  As per patient he is clean for 6 years after rehabilitation.   Hyperlipidemia: Continue with statin   Chronic kidney disease stage IIIa,  likely secondary to diabetes: Will need outpatient monitoring.  Diabetes mellitus:  On metformin, Lantus and NovoLog at home.  Will resume on discharge.  Disposition.  At this time, patient is stable for disposition home with outpatient PCP [facility]  Medical Consultants:   Neurology.  Procedures:    None Subjective:   Today, patient was seen and examined at bedside.  Wishes to go home.  Denies any headache, dizziness, lightheadedness at the time of my exam.  Discharge Exam:   Vitals:   07/16/20 0336 07/16/20 0730  BP: (!) 168/106 119/85  Pulse: (!) 59 71  Resp: 17 18  Temp: 97.6 F (36.4 C) 97.8 F (36.6 C)  SpO2: 100% 100%   Vitals:   07/15/20 1932 07/15/20 2359 07/16/20 0336 07/16/20 0730  BP: Marland Kitchen(!)  162/101 (!) 158/100 (!) 168/106 119/85  Pulse: 65 62 (!) 59 71  Resp: Temp: 97.7 F (36.5 C) 97.8 F (36.6 C) 97.6 F (36.4 C) 97.8 F (36.6 C)  TempSrc: Oral Axillary Oral Oral  SpO2: 100% 98% 100% 100%   General: Alert awake, not in obvious distress HENT: pupils equally reacting to  light,  No scleral pallor or icterus noted. Oral mucosa is moist.  Chest:  Clear breath sounds.  Diminished breath sounds bilaterally. No crackles or wheezes.  CVS: S1 &S2 heard. No murmur.  Regular rate and rhythm. Abdomen: Soft, nontender, nondistended.  Bowel sounds are heard.   Extremities: No cyanosis, clubbing or edema.  Peripheral pulses are palpable. Psych: Alert, awake and oriented, normal mood CNS:  No cranial nerve deficits.  Power equal in all extremities.   Skin: Warm and dry.  No rashes noted.  The results of significant diagnostics from this hospitalization (including imaging, microbiology, ancillary and laboratory) are listed below for reference.     Diagnostic Studies:   DG Chest 2 View  Result Date: 07/14/2020 CLINICAL DATA:  Hypertension. EXAM: CHEST - 2 VIEW COMPARISON:  Jun 22, 2017. FINDINGS: The heart size and mediastinal contours are within normal limits. Both lungs are clear. The visualized skeletal structures are unremarkable. IMPRESSION: No active cardiopulmonary disease. Electronically Signed   By: Lupita Raider M.D.   On: 07/14/2020 16:06   CT Head Wo Contrast  Result Date: 07/14/2020 CLINICAL DATA:  Worsening headache with hypertension EXAM: CT HEAD WITHOUT CONTRAST TECHNIQUE: Contiguous axial images were obtained from the base of the skull through the vertex without intravenous contrast. COMPARISON:  CT brain 02/26/2017 FINDINGS: Brain: No hemorrhage or intracranial mass is visualized. Interval mild patchy white matter hypodensity likely chronic small vessel ischemic change. Possible acute or subacute infarct within the right basal ganglia and white matter. Ventricles are nonenlarged. Vascular: No hyperdense vessels.  Carotid vascular calcification Skull: Normal. Negative for fracture or focal lesion. Sinuses/Orbits: Mucosal thickening in the sinuses. Old fracture medial wall left orbit Other: None IMPRESSION: 1. Possible acute to subacute lacunar infarct within the  right basal ganglia and white matter. Further evaluation with MRI may be considered. 2. Negative for acute intracranial hemorrhage 3. Other patchy white matter hypodensity likely chronic small vessel ischemic change Electronically Signed   By: Jasmine Pang M.D.   On: 07/14/2020 20:07   MR ANGIO HEAD WO CONTRAST  Result Date: 07/15/2020 CLINICAL DATA:  Follow-up right frontal stroke. EXAM: MRA HEAD WITHOUT CONTRAST TECHNIQUE: Angiographic images of the Circle of Willis were acquired using MRA technique without intravenous contrast. COMPARISON:  No pertinent prior exam. FINDINGS: Both internal carotid arteries are widely patent through the skull base and siphon regions. The anterior and middle cerebral vessels appear normal without proximal stenosis, aneurysm or vascular malformation. Both vertebral arteries widely patent to the basilar. No basilar stenosis. Posterior circulation branch vessels show flow. There do appear to be some distal vessel narrowings in the left PCA territory. IMPRESSION: Normal appearing anterior circulation. Major posterior circulation branch vessels show flow. Some atherosclerotic narrowing of the branch vessels in the left PCA territory. Electronically Signed   By: Paulina Fusi M.D.   On: 07/15/2020 15:14   MR Brain Wo Contrast (neuro protocol)  Result Date: 07/14/2020 CLINICAL DATA:  Neuro deficit, acute, stroke suspected EXAM: MRI HEAD WITHOUT CONTRAST TECHNIQUE: Multiplanar, multiecho pulse sequences of the brain and surrounding structures were obtained without intravenous contrast. COMPARISON:  None. FINDINGS: Brain: Punctate focus of abnormal diffusion restriction within the paramedian right frontal lobe (series 5, image 85). No other diffusion abnormality. No acute or chronic hemorrhage. Hyperintense T2-weighted signal is moderately widespread throughout the white matter. Parenchymal volume and CSF spaces are normal. Hyperintense T2-weighted signal also within the brainstem. The  midline structures are normal. Vascular: Major flow voids are preserved. Skull and upper cervical spine: Normal calvarium and skull base. Visualized upper cervical spine and soft tissues are normal. Sinuses/Orbits:Mild right maxillary sinus mucosal thickening. Left maxillary sinus atelectasis. Normal orbits. IMPRESSION: 1. Punctate focus of abnormal diffusion restriction within the paramedian right frontal lobe, consistent with small acute infarct. No hemorrhage or mass effect. 2. Moderate chronic small vessel ischemic disease. Electronically Signed   By: Deatra Robinson M.D.   On: 07/14/2020 22:50   ECHOCARDIOGRAM COMPLETE  Result Date: 07/15/2020    ECHOCARDIOGRAM REPORT   Patient Name:   LONDEN BOK Date of Exam: 07/15/2020 Medical Rec #:  [id number]          Height:       69.0 in Accession #:    [id number]         Weight:       170.0 lb Date of Birth:  29-Sep-1960         BSA:          1.928 m Patient Age:    59 years           BP:           136/98 mmHg Patient Gender: M                  HR:           62 bpm. Exam Location:  Inpatient Procedure: 2D Echo, Cardiac Doppler and Color Doppler Indications:    Stroke  History:        Patient has no prior history of Echocardiogram examinations.                 Risk Factors:Diabetes, Dyslipidemia and Current Smoker.  Sonographer:    Shirlean Kelly Referring Phys: [phone] MOHAMMAD L GARBA IMPRESSIONS  1. Left ventricular ejection fraction, by estimation, is 55 to 60%. The left ventricle has normal function. The left ventricle demonstrates regional wall motion abnormalities (see scoring diagram/findings for description). There is severe concentric left ventricular hypertrophy. Left ventricular diastolic parameters are indeterminate. Elevated left ventricular end-diastolic pressure. There is hypokinesis of the left ventricular, basal inferior wall and inferolateral wall.  2. Right ventricular systolic function is normal. The right ventricular size is normal.  3. The  mitral valve is normal in structure. Mild to moderate mitral valve regurgitation. No evidence of mitral stenosis.  4. The aortic valve is tricuspid. Aortic valve regurgitation is not visualized. Mild to moderate aortic valve sclerosis/calcification is present, without any evidence of aortic stenosis.  5. Aortic dilatation noted. There is borderline dilatation of the aortic root, measuring 37 mm.  6. The inferior vena cava is normal in size with greater than 50% respiratory variability, suggesting right atrial pressure of 3 mmHg. FINDINGS  Left Ventricle: Left ventricular ejection fraction, by estimation, is 55 to 60%. The left ventricle has normal function. The left ventricle demonstrates regional wall motion abnormalities. The left ventricular internal cavity size was normal in size. There is severe concentric left ventricular hypertrophy. Left ventricular diastolic parameters are indeterminate. Elevated left ventricular end-diastolic pressure. Right Ventricle: The right ventricular size is normal. No increase in  right ventricular wall thickness. Right ventricular systolic function is normal. Left Atrium: Left atrial size was normal in size. Right Atrium: Right atrial size was normal in size. Pericardium: There is no evidence of pericardial effusion. Mitral Valve: The mitral valve is normal in structure. Mild to moderate mitral valve regurgitation. No evidence of mitral valve stenosis. Tricuspid Valve: The tricuspid valve is normal in structure. Tricuspid valve regurgitation is mild . No evidence of tricuspid stenosis. Aortic Valve: The aortic valve is tricuspid. Aortic valve regurgitation is not visualized. Mild to moderate aortic valve sclerosis/calcification is present, without any evidence of aortic stenosis. Aortic valve mean gradient measures 2.0 mmHg. Aortic valve peak gradient measures 4.6 mmHg. Aortic valve area, by VTI measures 2.56 cm. Pulmonic Valve: The pulmonic valve was normal in structure. Pulmonic  valve regurgitation is not visualized. No evidence of pulmonic stenosis. Aorta: Aortic dilatation noted. There is borderline dilatation of the aortic root, measuring 37 mm. Venous: The inferior vena cava is normal in size with greater than 50% respiratory variability, suggesting right atrial pressure of 3 mmHg. IAS/Shunts: No atrial level shunt detected by color flow Doppler.  LEFT VENTRICLE PLAX 2D LVIDd:         4.40 cm      Diastology LVIDs:         2.70 cm      LV e' medial:    6.74 cm/s LV PW:         1.70 cm      LV E/e' medial:  14.4 LV IVS:        1.70 cm      LV e' lateral:   8.38 cm/s LVOT diam:     1.90 cm      LV E/e' lateral: 11.6 LV SV:         58 LV SV Index:   30 LVOT Area:     2.84 cm  LV Volumes (MOD) LV vol d, MOD A2C: 107.0 ml LV vol d, MOD A4C: 102.0 ml LV vol s, MOD A2C: 62.7 ml LV vol s, MOD A4C: 60.0 ml LV SV MOD A2C:     44.3 ml LV SV MOD A4C:     102.0 ml LV SV MOD BP:      42.6 ml RIGHT VENTRICLE             IVC RV Basal diam:  3.30 cm     IVC diam: 1.50 cm RV S prime:     10.30 cm/s TAPSE (M-mode): 1.9 cm LEFT ATRIUM             Index       RIGHT ATRIUM           Index LA diam:        3.80 cm 1.97 cm/m  RA Area:     14.55 cm LA Vol (A2C):   58.4 ml 30.29 ml/m RA Volume:   38.35 ml  19.89 ml/m LA Vol (A4C):   64.8 ml 33.61 ml/m LA Biplane Vol: 64.4 ml 33.40 ml/m  AORTIC VALVE AV Area (Vmax):    2.70 cm AV Area (Vmean):   2.46 cm AV Area (VTI):     2.56 cm AV Vmax:           107.00 cm/s AV Vmean:          71.700 cm/s AV VTI:            0.227 m AV Peak Grad:      4.6  mmHg AV Mean Grad:      2.0 mmHg LVOT Vmax:         102.00 cm/s LVOT Vmean:        62.100 cm/s LVOT VTI:          0.205 m LVOT/AV VTI ratio: 0.90  AORTA Ao Root diam: 3.70 cm Ao Asc diam:  3.00 cm MITRAL VALVE               TRICUSPID VALVE MV Area (PHT): 4.06 cm    TR Peak grad:   26.0 mmHg MV Decel Time: 187 msec    TR Vmax:        255.00 cm/s MV E velocity: 97.30 cm/s MV A velocity: 72.80 cm/s  SHUNTS MV E/A ratio:   1.34        Systemic VTI:  0.20 m                            Systemic Diam: 1.90 cm Armanda Magic MD Electronically signed by Armanda Magic MD Signature Date/Time: 07/15/2020/10:45:10 AM    Final    VAS US CAROTID  Result Date: 07/15/2020 Carotid Arterial Duplex Study Patient Name:  TYREES CHOPIN  Date of Exam:   07/15/2020 Medical Rec #: [id number]           Accession #:    [id number] Date of Birth: [birth year]          Patient Gender: M Patient Age:   59Y Exam Location:  [facility] Procedure:      VAS US CAROTID Referring Phys: [id number] [facility] Ashanti Ratti --------------------------------------------------------------------------------  Indications:       Carotid artery disease. Risk Factors:      Diabetes. Other Factors:     CT scan: Possible acute to subacute lacunar infarct within                    the right basal                    ganglia and white matter. Comparison Study:  none Performing Technologist: Jeb Levering RDMS, RVT  Examination Guidelines: A complete evaluation includes B-mode imaging, spectral Doppler, color Doppler, and power Doppler as needed of all accessible portions of each vessel. Bilateral testing is considered an integral part of a complete examination. Limited examinations for reoccurring indications may be performed as noted.  Right Carotid Findings: +----------+--------+--------+--------+------------------+--------+           PSV cm/sEDV cm/sStenosisPlaque DescriptionComments +----------+--------+--------+--------+------------------+--------+ CCA Prox  86      12                                         +----------+--------+--------+--------+------------------+--------+ CCA Distal53      11                                         +----------+--------+--------+--------+------------------+--------+ ICA Prox  50      13      1-39%                              +----------+--------+--------+--------+------------------+--------+ ICA Distal49      15                                          +----------+--------+--------+--------+------------------+--------+  ECA       86      17                                         +----------+--------+--------+--------+------------------+--------+ +----------+--------+-------+----------------+-------------------+           PSV cm/sEDV cmsDescribe        Arm Pressure (mmHG) +----------+--------+-------+----------------+-------------------+ [id number]            Multiphasic, WNL                    +----------+--------+-------+----------------+-------------------+ +---------+--------+--+--------+-+---------+ VertebralPSV cm/s40EDV cm/s6Antegrade +---------+--------+--+--------+-+---------+  Left Carotid Findings: +----------+--------+--------+--------+------------------+--------+           PSV cm/sEDV cm/sStenosisPlaque DescriptionComments +----------+--------+--------+--------+------------------+--------+ CCA Prox  69      10                                         +----------+--------+--------+--------+------------------+--------+ CCA Distal55      11                                         +----------+--------+--------+--------+------------------+--------+ ICA Prox  41      12      1-39%                              +----------+--------+--------+--------+------------------+--------+ ICA Distal71      28                                         +----------+--------+--------+--------+------------------+--------+ ECA       60      12                                         +----------+--------+--------+--------+------------------+--------+ +----------+--------+--------+----------------+-------------------+           PSV cm/sEDV cm/sDescribe        Arm Pressure (mmHG) +----------+--------+--------+----------------+-------------------+ Subclavian117             Multiphasic, WNL                     +----------+--------+--------+----------------+-------------------+ +---------+--------+--+--------+-+---------+ VertebralPSV cm/s31EDV cm/s8Antegrade +---------+--------+--+--------+-+---------+   Summary: Right Carotid: Velocities in the right ICA are consistent with a 1-39% stenosis. Left Carotid: Velocities in the left ICA are consistent with a 1-39% stenosis.  *See table(s) above for measurements and observations.     Preliminary      Labs:   Basic Metabolic Panel: Recent Labs  Lab 07/14/20 1517 07/15/20 0330  NA 138 134*  K 4.3 3.8  CL 108 102  CO2 25 25  GLUCOSE 101* 233*  BUN 15 16  CREATININE 1.79* 1.63*  CALCIUM 9.5 9.1   GFR CrCl cannot be calculated (Unknown ideal weight.). Liver Function Tests: Recent Labs  Lab 07/15/20 0330  AST 19  ALT 17  ALKPHOS 39  BILITOT 0.4  PROT 5.6*  ALBUMIN 2.9*   No results for input(s): LIPASE, AMYLASE in the last 168 hours. No results  for input(s): AMMONIA in the last 168 hours. Coagulation profile No results for input(s): INR, PROTIME in the last 168 hours.  CBC: Recent Labs  Lab 07/14/20 1517 07/15/20 0330  WBC 6.3 7.6  HGB 16.2 14.9  HCT 47.9 43.6  MCV 90.2 89.2  PLT 202 181   Cardiac Enzymes: No results for input(s): CKTOTAL, CKMB, CKMBINDEX, TROPONINI in the last 168 hours. BNP: Invalid input(s): POCBNP CBG: Recent Labs  Lab 07/15/20 0547 07/15/20 1147 07/15/20 1707 07/15/20 2146 07/16/20 0612  GLUCAP 205* 196* 290* 229* 256*   D-Dimer No results for input(s): DDIMER in the last 72 hours. Hgb A1c Recent Labs    07/15/20 0330  HGBA1C 8.5*   Lipid Profile Recent Labs    07/15/20 0330  CHOL 139  HDL 42  LDLCALC 80  TRIG 87  CHOLHDL 3.3   Thyroid function studies No results for input(s): TSH, T4TOTAL, T3FREE, THYROIDAB in the last 72 hours.  Invalid input(s): FREET3 Anemia work up No results for input(s): VITAMINB12, FOLATE, FERRITIN, TIBC, IRON, RETICCTPCT in the last 72  hours. Microbiology Recent Results (from the past 240 hour(s))  Resp Panel by RT-PCR (Flu A&B, Covid) Nasopharyngeal Swab     Status: None   Collection Time: 07/14/20 11:37 PM   Specimen: Nasopharyngeal Swab; Nasopharyngeal(NP) swabs in vial transport medium  Result Value Ref Range Status   SARS Coronavirus 2 by RT PCR NEGATIVE NEGATIVE Final    Comment: (NOTE) SARS-CoV-2 target nucleic acids are NOT DETECTED.  The SARS-CoV-2 RNA is generally detectable in upper respiratory specimens during the acute phase of infection. The lowest concentration of SARS-CoV-2 viral copies this assay can detect is 138 copies/mL. A negative result does not preclude SARS-Cov-2 infection and should not be used as the sole basis for treatment or other patient management decisions. A negative result may occur with  improper specimen collection/handling, submission of specimen other than nasopharyngeal swab, presence of viral mutation(s) within the areas targeted by this assay, and inadequate number of viral copies(<138 copies/mL). A negative result must be combined with clinical observations, patient history, and epidemiological information. The expected result is Negative.  Fact Sheet for Patients:  BloggerCourse.com  Fact Sheet for Healthcare Providers:  SeriousBroker.it  This test is no t yet approved or cleared by the [place] FDA and  has been authorized for detection and/or diagnosis of SARS-CoV-2 by FDA under an Emergency Use Authorization (EUA). This EUA will remain  in effect (meaning this test can be used) for the duration of the COVID-19 declaration under Section 564(b)(1) of the Act, 21 U.S.C.section 360bbb-3(b)(1), unless the authorization is terminated  or revoked sooner.       Influenza A by PCR NEGATIVE NEGATIVE Final   Influenza B by PCR NEGATIVE NEGATIVE Final    Comment: (NOTE) The Xpert Xpress SARS-CoV-2/FLU/RSV plus assay  is intended as an aid in the diagnosis of influenza from Nasopharyngeal swab specimens and should not be used as a sole basis for treatment. Nasal washings and aspirates are unacceptable for Xpert Xpress SARS-CoV-2/FLU/RSV testing.  Fact Sheet for Patients: BloggerCourse.com  Fact Sheet for Healthcare Providers: SeriousBroker.it  This test is not yet approved or cleared by the [place] FDA and has been authorized for detection and/or diagnosis of SARS-CoV-2 by FDA under an Emergency Use Authorization (EUA). This EUA will remain in effect (meaning this test can be used) for the duration of the COVID-19 declaration under Section 564(b)(1) of the Act, 21 U.S.C. section 360bbb-3(b)(1), unless the authorization  is terminated or revoked.  Performed at [facility], [address] [place], [place] [address]      Discharge Instructions:   Discharge Instructions     Ambulatory referral to Neurology   Complete by: As directed    Follow up with stroke clinic NP (Jessica Vanschaick or Darrol Angel, if both not available, consider Manson Allan, or Ahern) at [facility] in about 4 weeks. Thanks.   Diet - low sodium heart healthy   Complete by: As directed    Diet Carb Modified   Complete by: As directed    Discharge instructions   Complete by: As directed    Please follow up with your primary care provider in one week to focus on good diabetes and blood pressure control. Cholesterol medication dose has been increased, you have been prescribed plavix and will need to stop aspirin in 3 weeks. No smoking please. You have been referred to [facility] (office to call you for appointment)   Increase activity slowly   Complete by: As directed       Allergies as of 07/16/2020       Reactions   Onion Anaphylaxis, Swelling   TONGUE AND FACIAL SWELLING   Other Other (See Comments)   Onions-throat swelling         Medication List     STOP taking these medications    ibuprofen 800 MG tablet Commonly known as: ADVIL       TAKE these medications    Aspirin Low Dose 81 MG EC tablet Generic drug: aspirin Take 1 tablet (81 mg total) by mouth daily for 21 days.   clopidogrel 75 MG tablet Commonly known as: PLAVIX Take 1 tablet (75 mg total) by mouth daily.   doxepin 10 MG capsule Commonly known as: SINEQUAN Take 10 mg by mouth at bedtime.   gabapentin 800 MG tablet Commonly known as: NEURONTIN Take 800 mg by mouth 4 (four) times daily.   insulin aspart 100 UNIT/ML injection Commonly known as: novoLOG Inject 0-20 Units into the skin 3 (three) times daily. Per sliding scale  Based on [facility]  readings   Lantus SoloStar 100 UNIT/ML Solostar Pen Generic drug: insulin glargine Inject 50 Units into the skin at bedtime.   lisinopril 10 MG tablet Commonly known as: ZESTRIL Take 1 tablet by mouth daily.   loratadine 10 MG tablet Commonly known as: CLARITIN Take 10 mg by mouth daily.   lovastatin 40 MG tablet Commonly known as: MEVACOR Take 1 tablet (40 mg total) by mouth daily. What changed:  medication strength how much to take   metFORMIN 500 MG tablet Commonly known as: GLUCOPHAGE Take 500 mg by mouth 2 (two) times daily with a meal.   metoprolol succinate 50 MG 24 hr tablet Commonly known as: TOPROL-XL Take 50 mg by mouth daily.   pantoprazole 40 MG tablet Commonly known as: PROTONIX Take 40 mg by mouth daily.   tadalafil 10 MG tablet Commonly known as: CIALIS Take 10 mg by mouth as needed.           Follow-up Information     [facility]. Schedule an appointment as soon as possible for a visit in 4 week(s).   Specialty: Neurology Contact information: [address] [place] [address] [phone]                 Time coordinating discharge: 39 minutes  Signed:  Lyndall Windt  [facility] 07/16/2020,  8:48 AM

## 2020-08-02 ENCOUNTER — Other Ambulatory Visit: Payer: Self-pay | Admitting: *Deleted

## 2020-08-02 NOTE — Patient Outreach (Signed)
[facility]) Care Management  08/02/2020  Johnte Portnoy 1960/12/07 [id number]   RED ON EMMI ALERT - Stroke  Day # 13 Date: 6/17 Red Alert Reason:Not been to follow up appointment   Outreach attempt #1, successful.  Identity verified.  This care manager introduced self and stated purpose of call.  [facility] care management services explained.    He report he has been doing well, much better than when he was initially discharged.  He lives alone, able to care for self independently.  He manages his own meals but state it does get frustrating when having to find the correct things to eat according to his low salt/diabetic diet.  Report he has been monitoring his blood pressure daily, today was 160/90, previously was in he 200's systolic.  He has follow up appointment with neurology on 7/14, will secure transportation through Medicaid.  State he is taking medications as instructed, denies need for financial assistance.  Denies any urgent concerns, encouraged to contact this care manager with questions.    Plan: RN CM will send information regarding HTN management follow up within the next 3 weeks.  Kemper Durie, [place], MSN [facility] Main Care Management  [facility] Manager [phone]

## 2020-08-24 ENCOUNTER — Inpatient Hospital Stay: Payer: Medicaid Other | Admitting: Adult Health

## 2020-08-26 ENCOUNTER — Inpatient Hospital Stay: Payer: Self-pay | Admitting: Adult Health

## 2020-08-27 ENCOUNTER — Other Ambulatory Visit: Payer: Self-pay | Admitting: *Deleted

## 2020-08-27 NOTE — Patient Outreach (Signed)
[facility]) Care Management  08/27/2020  Eduardo Weeks [birth year] [id number]   Outgoing call placed to member, state he is doing well.  Hasn't had any complications from stroke, follow up appointment has been rescheduled for 8/17.  Also has appointments with endocrinology on 7/20, podiatry on 9/19, and PCP on 9/20.  Denies any further needs, will close case at this time.  Kemper Durie, [place], MSN [facility] Care Management  [facility] Manager [phone]

## 2020-09-29 ENCOUNTER — Inpatient Hospital Stay: Payer: Medicaid Other | Admitting: Adult Health

## 2020-11-02 ENCOUNTER — Other Ambulatory Visit: Payer: Self-pay

## 2020-11-02 ENCOUNTER — Encounter (HOSPITAL_COMMUNITY): Payer: Self-pay | Admitting: Emergency Medicine

## 2020-11-02 ENCOUNTER — Emergency Department (HOSPITAL_COMMUNITY): Payer: Medicaid Other

## 2020-11-02 ENCOUNTER — Inpatient Hospital Stay (HOSPITAL_COMMUNITY)
Admission: EM | Admit: 2020-11-02 | Discharge: 2020-11-05 | DRG: 247 | Disposition: A | Discharge status: Home / Self Care | Payer: Medicaid Other | Attending: Internal Medicine | Admitting: Internal Medicine

## 2020-11-02 DIAGNOSIS — I200 Unstable angina: Secondary | ICD-10-CM

## 2020-11-02 DIAGNOSIS — N1832 Chronic kidney disease, stage 3b: Secondary | ICD-10-CM | POA: Diagnosis present

## 2020-11-02 DIAGNOSIS — R079 Chest pain, unspecified: Secondary | ICD-10-CM | POA: Diagnosis not present

## 2020-11-02 DIAGNOSIS — N529 Male erectile dysfunction, unspecified: Secondary | ICD-10-CM | POA: Diagnosis present

## 2020-11-02 DIAGNOSIS — E1159 Type 2 diabetes mellitus with other circulatory complications: Secondary | ICD-10-CM | POA: Diagnosis present

## 2020-11-02 DIAGNOSIS — Z7902 Long term (current) use of antithrombotics/antiplatelets: Secondary | ICD-10-CM

## 2020-11-02 DIAGNOSIS — Z833 Family history of diabetes mellitus: Secondary | ICD-10-CM

## 2020-11-02 DIAGNOSIS — I25110 Atherosclerotic heart disease of native coronary artery with unstable angina pectoris: Principal | ICD-10-CM | POA: Diagnosis present

## 2020-11-02 DIAGNOSIS — B182 Chronic viral hepatitis C: Secondary | ICD-10-CM | POA: Diagnosis present

## 2020-11-02 DIAGNOSIS — Z8616 Personal history of COVID-19: Secondary | ICD-10-CM

## 2020-11-02 DIAGNOSIS — Z20822 Contact with and (suspected) exposure to covid-19: Secondary | ICD-10-CM | POA: Diagnosis present

## 2020-11-02 DIAGNOSIS — E1169 Type 2 diabetes mellitus with other specified complication: Secondary | ICD-10-CM | POA: Diagnosis present

## 2020-11-02 DIAGNOSIS — Z87892 Personal history of anaphylaxis: Secondary | ICD-10-CM

## 2020-11-02 DIAGNOSIS — Z79899 Other long term (current) drug therapy: Secondary | ICD-10-CM

## 2020-11-02 DIAGNOSIS — F17210 Nicotine dependence, cigarettes, uncomplicated: Secondary | ICD-10-CM | POA: Diagnosis present

## 2020-11-02 DIAGNOSIS — Z8249 Family history of ischemic heart disease and other diseases of the circulatory system: Secondary | ICD-10-CM

## 2020-11-02 DIAGNOSIS — E119 Type 2 diabetes mellitus without complications: Secondary | ICD-10-CM

## 2020-11-02 DIAGNOSIS — F1010 Alcohol abuse, uncomplicated: Secondary | ICD-10-CM | POA: Diagnosis present

## 2020-11-02 DIAGNOSIS — Z7984 Long term (current) use of oral hypoglycemic drugs: Secondary | ICD-10-CM

## 2020-11-02 DIAGNOSIS — I2582 Chronic total occlusion of coronary artery: Secondary | ICD-10-CM | POA: Diagnosis present

## 2020-11-02 DIAGNOSIS — E1140 Type 2 diabetes mellitus with diabetic neuropathy, unspecified: Secondary | ICD-10-CM | POA: Diagnosis present

## 2020-11-02 DIAGNOSIS — Z955 Presence of coronary angioplasty implant and graft: Secondary | ICD-10-CM

## 2020-11-02 DIAGNOSIS — N183 Chronic kidney disease, stage 3 unspecified: Secondary | ICD-10-CM | POA: Diagnosis present

## 2020-11-02 DIAGNOSIS — F1410 Cocaine abuse, uncomplicated: Secondary | ICD-10-CM | POA: Diagnosis present

## 2020-11-02 DIAGNOSIS — E785 Hyperlipidemia, unspecified: Secondary | ICD-10-CM | POA: Diagnosis present

## 2020-11-02 DIAGNOSIS — K7030 Alcoholic cirrhosis of liver without ascites: Secondary | ICD-10-CM | POA: Diagnosis present

## 2020-11-02 DIAGNOSIS — E1165 Type 2 diabetes mellitus with hyperglycemia: Secondary | ICD-10-CM | POA: Diagnosis present

## 2020-11-02 DIAGNOSIS — Z8673 Personal history of transient ischemic attack (TIA), and cerebral infarction without residual deficits: Secondary | ICD-10-CM

## 2020-11-02 DIAGNOSIS — Z91018 Allergy to other foods: Secondary | ICD-10-CM

## 2020-11-02 DIAGNOSIS — E1122 Type 2 diabetes mellitus with diabetic chronic kidney disease: Secondary | ICD-10-CM | POA: Diagnosis present

## 2020-11-02 DIAGNOSIS — I152 Hypertension secondary to endocrine disorders: Secondary | ICD-10-CM | POA: Diagnosis present

## 2020-11-02 DIAGNOSIS — Z794 Long term (current) use of insulin: Secondary | ICD-10-CM

## 2020-11-02 HISTORY — DX: Chronic kidney disease, stage 3 unspecified: N18.30

## 2020-11-02 HISTORY — DX: Essential (primary) hypertension: I10

## 2020-11-02 HISTORY — DX: Personal history of transient ischemic attack (TIA), and cerebral infarction without residual deficits: Z86.73

## 2020-11-02 LAB — CBC
HCT: 43.4 % (ref 39.0–52.0)
Hemoglobin: 15.1 g/dL (ref 13.0–17.0)
MCH: 31.5 pg (ref 26.0–34.0)
MCHC: 34.8 g/dL (ref 30.0–36.0)
MCV: 90.4 fL (ref 80.0–100.0)
Platelets: 190 10*3/uL (ref 150–400)
RBC: 4.8 MIL/uL (ref 4.22–5.81)
RDW: 12.4 % (ref 11.5–15.5)
WBC: 6 10*3/uL (ref 4.0–10.5)
nRBC: 0 % (ref 0.0–0.2)

## 2020-11-02 LAB — RAPID URINE DRUG SCREEN, HOSP PERFORMED
Amphetamines: NOT DETECTED
Barbiturates: NOT DETECTED
Benzodiazepines: NOT DETECTED
Cocaine: NOT DETECTED
Opiates: NOT DETECTED
Tetrahydrocannabinol: NOT DETECTED

## 2020-11-02 LAB — COMPREHENSIVE METABOLIC PANEL
ALT: 17 U/L (ref 0–44)
AST: 22 U/L (ref 15–41)
Albumin: 3.5 g/dL (ref 3.5–5.0)
Alkaline Phosphatase: 57 U/L (ref 38–126)
Anion gap: 7 (ref 5–15)
BUN: 18 mg/dL (ref 6–20)
CO2: 21 mmol/L — ABNORMAL LOW (ref 22–32)
Calcium: 9.6 mg/dL (ref 8.9–10.3)
Chloride: 110 mmol/L (ref 98–111)
Creatinine, Ser: 2.11 mg/dL — ABNORMAL HIGH (ref 0.61–1.24)
GFR, Estimated: 35 mL/min — ABNORMAL LOW (ref >60)
Glucose, Bld: 123 mg/dL — ABNORMAL HIGH (ref 70–99)
Potassium: 4.3 mmol/L (ref 3.5–5.1)
Sodium: 138 mmol/L (ref 135–145)
Total Bilirubin: 0.4 mg/dL (ref 0.3–1.2)
Total Protein: 6.5 g/dL (ref 6.5–8.1)

## 2020-11-02 LAB — TROPONIN I (HIGH SENSITIVITY): Troponin I (High Sensitivity): 23 ng/L — ABNORMAL HIGH (ref <18)

## 2020-11-02 LAB — D-DIMER, QUANTITATIVE: D-Dimer, Quant: 0.69 ug/mL-FEU — ABNORMAL HIGH (ref 0.00–0.50)

## 2020-11-02 MED ORDER — INSULIN GLARGINE-YFGN 100 UNIT/ML ~~LOC~~ SOLN
20.0000 [IU] | Freq: Every day | SUBCUTANEOUS | Status: DC
  Administered 2020-11-03 – 2020-11-05 (×2): 20 [IU] via SUBCUTANEOUS
  Filled 2020-11-02 (×4): qty 0.2

## 2020-11-02 MED ORDER — ACETAMINOPHEN 325 MG PO TABS
650.0000 mg | ORAL_TABLET | ORAL | Status: DC | PRN
  Administered 2020-11-04: 650 mg via ORAL
  Filled 2020-11-02: qty 2

## 2020-11-02 MED ORDER — GABAPENTIN 400 MG PO CAPS
800.0000 mg | ORAL_CAPSULE | Freq: Four times a day (QID) | ORAL | Status: DC
  Administered 2020-11-02 – 2020-11-04 (×8): 800 mg via ORAL
  Filled 2020-11-02: qty 2
  Filled 2020-11-02 (×2): qty 8
  Filled 2020-11-02 (×9): qty 2

## 2020-11-02 MED ORDER — INSULIN ASPART 100 UNIT/ML IJ SOLN
0.0000 [IU] | Freq: Three times a day (TID) | INTRAMUSCULAR | Status: DC
  Administered 2020-11-03: 2 [IU] via SUBCUTANEOUS
  Administered 2020-11-04: 3 [IU] via SUBCUTANEOUS
  Administered 2020-11-05: 2 [IU] via SUBCUTANEOUS

## 2020-11-02 MED ORDER — METOPROLOL SUCCINATE ER 25 MG PO TB24
50.0000 mg | ORAL_TABLET | Freq: Every day | ORAL | Status: DC
  Administered 2020-11-03: 50 mg via ORAL
  Filled 2020-11-02: qty 2

## 2020-11-02 MED ORDER — PANTOPRAZOLE SODIUM 40 MG PO TBEC
40.0000 mg | DELAYED_RELEASE_TABLET | Freq: Every day | ORAL | Status: DC
  Administered 2020-11-03 – 2020-11-05 (×3): 40 mg via ORAL
  Filled 2020-11-02 (×3): qty 1

## 2020-11-02 MED ORDER — CLOPIDOGREL BISULFATE 75 MG PO TABS
75.0000 mg | ORAL_TABLET | Freq: Every day | ORAL | Status: DC
  Administered 2020-11-03 – 2020-11-05 (×3): 75 mg via ORAL
  Filled 2020-11-02 (×4): qty 1

## 2020-11-02 MED ORDER — PRAVASTATIN SODIUM 40 MG PO TABS
40.0000 mg | ORAL_TABLET | Freq: Every day | ORAL | Status: DC
  Administered 2020-11-03 – 2020-11-04 (×2): 40 mg via ORAL
  Filled 2020-11-02 (×2): qty 1

## 2020-11-02 MED ORDER — SODIUM CHLORIDE 0.9 % IV SOLN: INTRAVENOUS | Status: DC

## 2020-11-02 MED ORDER — LISINOPRIL 10 MG PO TABS
10.0000 mg | ORAL_TABLET | Freq: Every day | ORAL | Status: DC
  Administered 2020-11-03 – 2020-11-05 (×3): 10 mg via ORAL
  Filled 2020-11-02 (×3): qty 1

## 2020-11-02 MED ORDER — ONDANSETRON HCL 4 MG/2ML IJ SOLN: 4.0000 mg | Freq: Four times a day (QID) | INTRAMUSCULAR | Status: DC | PRN

## 2020-11-02 MED ORDER — HYDRALAZINE HCL 20 MG/ML IJ SOLN
10.0000 mg | INTRAMUSCULAR | Status: DC | PRN
  Administered 2020-11-03: 10 mg via INTRAVENOUS
  Filled 2020-11-02: qty 1

## 2020-11-02 MED ORDER — HEPARIN SODIUM (PORCINE) 5000 UNIT/ML IJ SOLN
5000.0000 [IU] | Freq: Three times a day (TID) | INTRAMUSCULAR | Status: DC
  Administered 2020-11-02 – 2020-11-05 (×6): 5000 [IU] via SUBCUTANEOUS
  Filled 2020-11-02 (×7): qty 1
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
## 2020-11-02 NOTE — ED Triage Notes (Signed)
Pt reports chest pain x 2 days. Pt reports the pain starts when he is walking and "having intercourse." Pt reporting the pain stops when he sits down.
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
## 2020-11-02 NOTE — ED Provider Notes (Signed)
Emergency Medicine Provider Triage Evaluation Note  Eduardo Weeks , a 60 y.o. male  was evaluated in triage.  Pt complains of chest pain. He has had approximately 3 different episodes, each with exertion that last about 30 minutes. Most recent episode 2 days during intercourse. Pain goes away with rest. Had COVID a month ago, has been feeling well since. He is not currently having pain.   Review of Systems  Positive: CP, diaphoresis Negative: SOB, nausea, vomiting  Physical Exam  BP (!) 139/106 (BP Location: Left Arm)   Pulse 76   Temp 98.2 F (36.8 C) (Oral)   Resp 18   SpO2 98%  Gen:   Awake, no distress   Resp:  Normal effort  MSK:   Moves extremities without difficulty  Other:    Medical Decision Making  Medically screening exam initiated at 4:40 PM.  Appropriate orders placed.  Larrie Fraizer was informed that the remainder of the evaluation will be completed by another provider, this initial triage assessment does not replace that evaluation, and the importance of remaining in the ED until their evaluation is complete.     Jeanella Flattery 11/02/20 1641    Tegeler, Canary Brim, MD 11/02/20 [phone]

## 2020-11-03 ENCOUNTER — Ambulatory Visit (HOSPITAL_COMMUNITY)
Admission: EM | Disposition: A | Discharge status: Home / Self Care | Payer: Self-pay | Source: Home / Self Care | Attending: Internal Medicine

## 2020-11-03 ENCOUNTER — Observation Stay (HOSPITAL_COMMUNITY): Payer: Medicaid Other

## 2020-11-03 DIAGNOSIS — N1831 Chronic kidney disease, stage 3a: Secondary | ICD-10-CM

## 2020-11-03 DIAGNOSIS — R072 Precordial pain: Secondary | ICD-10-CM

## 2020-11-03 DIAGNOSIS — E1122 Type 2 diabetes mellitus with diabetic chronic kidney disease: Secondary | ICD-10-CM | POA: Diagnosis present

## 2020-11-03 DIAGNOSIS — E785 Hyperlipidemia, unspecified: Secondary | ICD-10-CM | POA: Diagnosis present

## 2020-11-03 DIAGNOSIS — E1169 Type 2 diabetes mellitus with other specified complication: Secondary | ICD-10-CM | POA: Diagnosis present

## 2020-11-03 DIAGNOSIS — I200 Unstable angina: Secondary | ICD-10-CM | POA: Diagnosis not present

## 2020-11-03 DIAGNOSIS — N529 Male erectile dysfunction, unspecified: Secondary | ICD-10-CM | POA: Diagnosis present

## 2020-11-03 DIAGNOSIS — I152 Hypertension secondary to endocrine disorders: Secondary | ICD-10-CM

## 2020-11-03 DIAGNOSIS — N183 Chronic kidney disease, stage 3 unspecified: Secondary | ICD-10-CM | POA: Diagnosis not present

## 2020-11-03 DIAGNOSIS — F1010 Alcohol abuse, uncomplicated: Secondary | ICD-10-CM | POA: Diagnosis present

## 2020-11-03 DIAGNOSIS — F1410 Cocaine abuse, uncomplicated: Secondary | ICD-10-CM | POA: Diagnosis present

## 2020-11-03 DIAGNOSIS — K7030 Alcoholic cirrhosis of liver without ascites: Secondary | ICD-10-CM | POA: Diagnosis present

## 2020-11-03 DIAGNOSIS — E1159 Type 2 diabetes mellitus with other circulatory complications: Secondary | ICD-10-CM

## 2020-11-03 DIAGNOSIS — F17210 Nicotine dependence, cigarettes, uncomplicated: Secondary | ICD-10-CM | POA: Diagnosis present

## 2020-11-03 DIAGNOSIS — Z8673 Personal history of transient ischemic attack (TIA), and cerebral infarction without residual deficits: Secondary | ICD-10-CM

## 2020-11-03 DIAGNOSIS — Z794 Long term (current) use of insulin: Secondary | ICD-10-CM | POA: Diagnosis not present

## 2020-11-03 DIAGNOSIS — I25110 Atherosclerotic heart disease of native coronary artery with unstable angina pectoris: Secondary | ICD-10-CM | POA: Diagnosis present

## 2020-11-03 DIAGNOSIS — Z79899 Other long term (current) drug therapy: Secondary | ICD-10-CM | POA: Diagnosis not present

## 2020-11-03 DIAGNOSIS — N1832 Chronic kidney disease, stage 3b: Secondary | ICD-10-CM | POA: Diagnosis present

## 2020-11-03 DIAGNOSIS — Z7902 Long term (current) use of antithrombotics/antiplatelets: Secondary | ICD-10-CM | POA: Diagnosis not present

## 2020-11-03 DIAGNOSIS — I2582 Chronic total occlusion of coronary artery: Secondary | ICD-10-CM | POA: Diagnosis present

## 2020-11-03 DIAGNOSIS — Z87892 Personal history of anaphylaxis: Secondary | ICD-10-CM | POA: Diagnosis not present

## 2020-11-03 DIAGNOSIS — Z955 Presence of coronary angioplasty implant and graft: Secondary | ICD-10-CM | POA: Diagnosis not present

## 2020-11-03 DIAGNOSIS — Z7984 Long term (current) use of oral hypoglycemic drugs: Secondary | ICD-10-CM | POA: Diagnosis not present

## 2020-11-03 DIAGNOSIS — E1165 Type 2 diabetes mellitus with hyperglycemia: Secondary | ICD-10-CM | POA: Diagnosis present

## 2020-11-03 DIAGNOSIS — Z720 Tobacco use: Secondary | ICD-10-CM

## 2020-11-03 DIAGNOSIS — Z20822 Contact with and (suspected) exposure to covid-19: Secondary | ICD-10-CM | POA: Diagnosis present

## 2020-11-03 DIAGNOSIS — R079 Chest pain, unspecified: Secondary | ICD-10-CM

## 2020-11-03 DIAGNOSIS — E119 Type 2 diabetes mellitus without complications: Secondary | ICD-10-CM | POA: Diagnosis not present

## 2020-11-03 DIAGNOSIS — E1140 Type 2 diabetes mellitus with diabetic neuropathy, unspecified: Secondary | ICD-10-CM | POA: Diagnosis present

## 2020-11-03 DIAGNOSIS — B182 Chronic viral hepatitis C: Secondary | ICD-10-CM | POA: Diagnosis present

## 2020-11-03 DIAGNOSIS — Z8616 Personal history of COVID-19: Secondary | ICD-10-CM | POA: Diagnosis not present

## 2020-11-03 HISTORY — PX: LEFT HEART CATH AND CORONARY ANGIOGRAPHY: CATH118249

## 2020-11-03 LAB — CBC
HCT: 43.8 % (ref 39.0–52.0)
Hemoglobin: 15.1 g/dL (ref 13.0–17.0)
MCH: 31.3 pg (ref 26.0–34.0)
MCHC: 34.5 g/dL (ref 30.0–36.0)
MCV: 90.9 fL (ref 80.0–100.0)
Platelets: 169 10*3/uL (ref 150–400)
RBC: 4.82 MIL/uL (ref 4.22–5.81)
RDW: 12.5 % (ref 11.5–15.5)
WBC: 6.2 10*3/uL (ref 4.0–10.5)
nRBC: 0 % (ref 0.0–0.2)

## 2020-11-03 LAB — ECHOCARDIOGRAM COMPLETE
AR max vel: 2.06 cm2
AV Area VTI: 1.91 cm2
AV Area mean vel: 1.86 cm2
AV Mean grad: 3 mmHg
AV Peak grad: 5.2 mmHg
Ao pk vel: 1.14 m/s
Area-P 1/2: 2.65 cm2
Height: 69 in
MV M vel: 5.55 m/s
MV Peak grad: 123.2 mmHg
S' Lateral: 2.5 cm
Single Plane A4C EF: 56.2 %
Weight: 2992 oz

## 2020-11-03 LAB — CBG MONITORING, ED
Glucose-Capillary: 143 mg/dL — ABNORMAL HIGH (ref 70–99)
Glucose-Capillary: 101 mg/dL — ABNORMAL HIGH (ref 70–99)

## 2020-11-03 LAB — LIPID PANEL
Cholesterol: 122 mg/dL (ref 0–200)
HDL: 38 mg/dL — ABNORMAL LOW (ref >40)
LDL Cholesterol: 70 mg/dL (ref 0–99)
Total CHOL/HDL Ratio: 3.2 RATIO
Triglycerides: 71 mg/dL (ref <150)
VLDL: 14 mg/dL (ref 0–40)

## 2020-11-03 LAB — BASIC METABOLIC PANEL
Anion gap: 8 (ref 5–15)
BUN: 18 mg/dL (ref 6–20)
CO2: 21 mmol/L — ABNORMAL LOW (ref 22–32)
Calcium: 9.2 mg/dL (ref 8.9–10.3)
Chloride: 107 mmol/L (ref 98–111)
Creatinine, Ser: 1.91 mg/dL — ABNORMAL HIGH (ref 0.61–1.24)
GFR, Estimated: 40 mL/min — ABNORMAL LOW (ref >60)
Glucose, Bld: 260 mg/dL — ABNORMAL HIGH (ref 70–99)
Potassium: 4.3 mmol/L (ref 3.5–5.1)
Sodium: 136 mmol/L (ref 135–145)

## 2020-11-03 LAB — TROPONIN I (HIGH SENSITIVITY)
Troponin I (High Sensitivity): 17 ng/L (ref <18)
Troponin I (High Sensitivity): 19 ng/L — ABNORMAL HIGH (ref <18)

## 2020-11-03 LAB — GLUCOSE, CAPILLARY
Glucose-Capillary: 71 mg/dL (ref 70–99)
Glucose-Capillary: 194 mg/dL — ABNORMAL HIGH (ref 70–99)
Glucose-Capillary: 189 mg/dL — ABNORMAL HIGH (ref 70–99)

## 2020-11-03 LAB — SARS CORONAVIRUS 2 (TAT 6-24 HRS): SARS Coronavirus 2: NEGATIVE

## 2020-11-03 SURGERY — LEFT HEART CATH AND CORONARY ANGIOGRAPHY
Anesthesia: LOCAL

## 2020-11-03 MED ORDER — SODIUM CHLORIDE 0.9 % WEIGHT BASED INFUSION
1.0000 mL/kg/h | INTRAVENOUS | Status: AC
  Administered 2020-11-03: 1 mL/kg/h via INTRAVENOUS

## 2020-11-03 MED ORDER — IOHEXOL 350 MG/ML SOLN
INTRAVENOUS | Status: DC | PRN
  Administered 2020-11-03: 40 mL

## 2020-11-03 MED ORDER — ASPIRIN 81 MG PO CHEW
81.0000 mg | CHEWABLE_TABLET | Freq: Every day | ORAL | Status: DC
  Administered 2020-11-03 – 2020-11-05 (×3): 81 mg via ORAL
  Filled 2020-11-03 (×2): qty 1

## 2020-11-03 MED ORDER — SODIUM CHLORIDE 0.9% FLUSH: 3.0000 mL | INTRAVENOUS | Status: DC | PRN

## 2020-11-03 MED ORDER — HEPARIN (PORCINE) IN NACL 1000-0.9 UT/500ML-% IV SOLN
INTRAVENOUS | Status: AC
  Filled 2020-11-03: qty 1000

## 2020-11-03 MED ORDER — MIDAZOLAM HCL 2 MG/2ML IJ SOLN
INTRAMUSCULAR | Status: AC
  Filled 2020-11-03: qty 2

## 2020-11-03 MED ORDER — MIDAZOLAM HCL 2 MG/2ML IJ SOLN
INTRAMUSCULAR | Status: DC | PRN
  Administered 2020-11-03: 1 mg via INTRAVENOUS

## 2020-11-03 MED ORDER — HEPARIN (PORCINE) IN NACL 1000-0.9 UT/500ML-% IV SOLN
INTRAVENOUS | Status: DC | PRN
  Administered 2020-11-03: 500 mL

## 2020-11-03 MED ORDER — FENTANYL CITRATE (PF) 100 MCG/2ML IJ SOLN
INTRAMUSCULAR | Status: DC | PRN
  Administered 2020-11-03: 50 ug via INTRAVENOUS

## 2020-11-03 MED ORDER — CARVEDILOL 12.5 MG PO TABS
12.5000 mg | ORAL_TABLET | Freq: Two times a day (BID) | ORAL | Status: DC
  Administered 2020-11-04 – 2020-11-05 (×3): 12.5 mg via ORAL
  Filled 2020-11-03 (×5): qty 1

## 2020-11-03 MED ORDER — SODIUM CHLORIDE 0.9 % IV SOLN: 250.0000 mL | INTRAVENOUS | Status: DC | PRN

## 2020-11-03 MED ORDER — SODIUM CHLORIDE 0.9 % WEIGHT BASED INFUSION: 1.0000 mL/kg/h | INTRAVENOUS | Status: DC

## 2020-11-03 MED ORDER — ASPIRIN 81 MG PO CHEW
CHEWABLE_TABLET | ORAL | Status: AC
  Filled 2020-11-03: qty 1

## 2020-11-03 MED ORDER — SODIUM CHLORIDE 0.9 % WEIGHT BASED INFUSION: 3.0000 mL/kg/h | INTRAVENOUS | Status: DC

## 2020-11-03 MED ORDER — VERAPAMIL HCL 2.5 MG/ML IV SOLN
INTRAVENOUS | Status: AC
  Filled 2020-11-03: qty 2

## 2020-11-03 MED ORDER — HEPARIN SODIUM (PORCINE) 1000 UNIT/ML IJ SOLN
INTRAMUSCULAR | Status: AC
  Filled 2020-11-03: qty 1

## 2020-11-03 MED ORDER — HEPARIN SODIUM (PORCINE) 1000 UNIT/ML IJ SOLN
INTRAMUSCULAR | Status: DC | PRN
  Administered 2020-11-03: 4000 [IU] via INTRAVENOUS

## 2020-11-03 MED ORDER — SODIUM CHLORIDE 0.9% FLUSH
3.0000 mL | Freq: Two times a day (BID) | INTRAVENOUS | Status: DC
  Administered 2020-11-04: 3 mL via INTRAVENOUS

## 2020-11-03 MED ORDER — LIDOCAINE HCL (PF) 1 % IJ SOLN
INTRAMUSCULAR | Status: DC | PRN
  Administered 2020-11-03: 2 mL

## 2020-11-03 MED ORDER — LIDOCAINE HCL (PF) 1 % IJ SOLN
INTRAMUSCULAR | Status: AC
  Filled 2020-11-03: qty 30

## 2020-11-03 MED ORDER — FENTANYL CITRATE (PF) 100 MCG/2ML IJ SOLN
INTRAMUSCULAR | Status: AC
  Filled 2020-11-03: qty 2

## 2020-11-03 MED ORDER — VERAPAMIL HCL 2.5 MG/ML IV SOLN
INTRAVENOUS | Status: DC | PRN
  Administered 2020-11-03: 10 mL via INTRA_ARTERIAL

## 2020-11-03 SURGICAL SUPPLY — 10 items
CATH INFINITI 5FR JK (CATHETERS) ×2 IMPLANT
CATH INFINITI JR4 5F (CATHETERS) ×2 IMPLANT
DEVICE RAD COMP TR BAND LRG (VASCULAR PRODUCTS) ×2 IMPLANT
GLIDESHEATH SLEND SS 6F .021 (SHEATH) ×2 IMPLANT
GUIDEWIRE INQWIRE 1.5J.035X260 (WIRE) ×1 IMPLANT
INQWIRE 1.5J .035X260CM (WIRE) ×2
KIT HEART LEFT (KITS) ×2 IMPLANT
PACK CARDIAC CATHETERIZATION (CUSTOM PROCEDURE TRAY) ×2 IMPLANT
TRANSDUCER W/STOPCOCK (MISCELLANEOUS) ×2 IMPLANT
TUBING CIL FLEX 10 FLL-RA (TUBING) ×2 IMPLANT

## 2020-11-03 NOTE — ED Notes (Signed)
Pt sitting in recliner chair °

## 2020-11-03 NOTE — Progress Notes (Signed)
    Discussed case with Dr. Kirke Corin.  Plan for PCI of RCA potentially tomorrow if renal function stable.  COuld consider PCI of diagonal at a later time.    Corky Crafts, MD

## 2020-11-03 NOTE — Interval H&P Note (Signed)
Cath Lab Visit (complete for each Cath Lab visit)  Clinical Evaluation Leading to the Procedure:   ACS: Yes.    Non-ACS:  n/a   History and Physical Interval Note:  11/03/2020 2:17 PM  Eduardo Weeks  has presented today for surgery, with the diagnosis of CHEST PAIN.  The various methods of treatment have been discussed with the patient and family. After consideration of risks, benefits and other options for treatment, the patient has consented to  Procedure(s): LEFT HEART CATH AND CORONARY ANGIOGRAPHY (N/A) as a surgical intervention.  The patient's history has been reviewed, patient examined, no change in status, stable for surgery.  I have reviewed the patient's chart and labs.  Questions were answered to the patient's satisfaction.     Lorine Bears
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
## 2020-11-03 NOTE — ED Notes (Signed)
Night coverage MD paged and message sent via Amion requesting diet order, pain medicine and follow up communication about elevated D-Dimer.

## 2020-11-03 NOTE — ED Notes (Signed)
Pt's wallet was given to security

## 2020-11-03 NOTE — Progress Notes (Signed)
Attempted to remove 3 cc air from TR band at 1540, however, site began oozing blood.  Replaced 3 cc air back into band.  Will continue to monitor.

## 2020-11-03 NOTE — ED Notes (Signed)
Pt ambulatory with steady gait to restroom. Pt returned to bed and is on monitors. Pt alert and oriented X4

## 2020-11-03 NOTE — ED Notes (Signed)
MD made aware of d-dimer.

## 2020-11-03 NOTE — H&P (View-Only) (Signed)
    Discussed case with Dr. Arida.  Plan for PCI of RCA potentially tomorrow if renal function stable.  COuld consider PCI of diagonal at a later time.    Johncharles Fusselman S, MD  
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
## 2020-11-03 NOTE — Progress Notes (Signed)
[facility] PROGRESS NOTE    Progress Note  Eduardo Weeks  XLK:[id number] DOB: 04/18/1960 DOA: 11/02/2020 PCP: Primus Bravo, NP     Brief Narrative:   Eduardo Weeks is an 60 y.o. 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  She has had a previous stress test in 2020 showed no inducible reversible ischemia    Assessment/Plan:   Typical Chest pain: Cardiac biomarkers are trending down, twelve-lead EKG showed sinus rhythm normal axis T wave inversions in the inferior leads and V6 likely due to LVH. UDS was negative. She was continued on Toprol and Plavix.  Essential hypertension: Blood pressure significantly elevated on admission, she was continued on Toprol and ACE inhibitor. Her blood pressure is improved now continue current regimen IV hydralazine as needed.  Chronic kidney disease stage IIIb:  With a baseline creatinine around 1.8-2.  Insulin-dependent diabetes mellitus type 2: With a last A1c of 7.7, placed on long-acting insulin per sliding scale. Agree with holding metformin.  History of CVA: Continue Plavix and statins.    DVT prophylaxis: lovenox Family Communication:none Status is: Observation  The patient remains OBS appropriate and will d/c before 2 midnights.  Dispo: The patient is from: Home              Anticipated d/c is to: Home              Patient currently is not medically stable to d/c.   Difficult to place patient No     Code Status:     Code Status Orders  (From admission, onward)           Start     Ordered   11/02/20 2236  Full code  Continuous         11/02/20 2237           Code Status History     Date Active Date Inactive Code Status Order ID Comments User Context   07/15/2020 0008 07/16/2020 1652 Full Code [id number]  Rometta Emery, MD ED         IV Access:   Peripheral IV   Procedures and diagnostic studies:   DG Chest 2 View  Result Date: 11/02/2020 CLINICAL DATA:  Chest pain. EXAM: CHEST - 2 VIEW COMPARISON:  July 14, 2020 FINDINGS: The heart size and mediastinal contours are within normal limits. Both lungs are clear. The visualized skeletal structures are unremarkable. IMPRESSION: No active cardiopulmonary disease. Electronically Signed   By: Aram Candela M.D.   On: 11/02/2020 17:55     Medical Consultants:   None.   Subjective:    Eduardo Weeks no current chest pain.  Objective:    Vitals:   11/03/20 0000 11/03/20 0130 11/03/20 0230 11/03/20 0541  BP: (!) 162/112 (!) 161/100 129/76 (!) 148/86  Pulse: 67 71 71 69  Resp: 18 16 16 16   Temp:    98.2 F (36.8 C)  TempSrc:    Oral  SpO2: 99% 99% 97% 100%  Weight:    84.8 kg  Height:    5\' 9"  (1.753 m)   SpO2: 100 %  No intake or output data in the  24 hours ending 11/03/20 0734 Filed Weights   11/03/20 0541  Weight: 84.8 kg    Exam: General exam: In no acute distress. Respiratory system: Good air movement and clear to auscultation. Cardiovascular system: S1 & S2 heard, RRR. No JVD. Gastrointestinal system: Abdomen is nondistended, soft and nontender.  Extremities: No pedal edema. Skin: No rashes, lesions or ulcers Psychiatry: Judgement and insight appear normal. Mood & affect appropriate.    Data Reviewed:    Labs: Basic Metabolic Panel: Recent Labs  Lab 11/02/20 1640 11/03/20 0321  NA 138 136  K 4.3 4.3  CL 110 107  CO2 21* 21*  GLUCOSE 123* 260*  BUN 18 18  CREATININE 2.11* 1.91*  CALCIUM 9.6 9.2   GFR Estimated Creatinine Clearance: 41.6 mL/min (A) (by C-G formula based on SCr of 1.91 mg/dL (H)). Liver  Function Tests: Recent Labs  Lab 11/02/20 1640  AST 22  ALT 17  ALKPHOS 57  BILITOT 0.4  PROT 6.5  ALBUMIN 3.5   No results for input(s): LIPASE, AMYLASE in the last 168 hours. No results for input(s): AMMONIA in the last 168 hours. Coagulation profile No results for input(s): INR, PROTIME in the last 168 hours. COVID-19 Labs  Recent Labs    11/02/20 2029  DDIMER 0.69*    Lab Results  Component Value Date   SARSCOV2NAA NEGATIVE 11/02/2020   SARSCOV2NAA NEGATIVE 07/14/2020    CBC: Recent Labs  Lab 11/02/20 1640 11/03/20 0321  WBC 6.0 6.2  HGB 15.1 15.1  HCT 43.4 43.8  MCV 90.4 90.9  PLT 190 169   Cardiac Enzymes: No results for input(s): CKTOTAL, CKMB, CKMBINDEX, TROPONINI in the last 168 hours. BNP (last 3 results) No results for input(s): PROBNP in the last 8760 hours. CBG: No results for input(s): GLUCAP in the last 168 hours. D-Dimer: Recent Labs    11/02/20 2029  DDIMER 0.69*   Hgb A1c: No results for input(s): HGBA1C in the last 72 hours. Lipid Profile: Recent Labs    11/03/20 0321  CHOL 122  HDL 38*  LDLCALC 70  TRIG 71  CHOLHDL 3.2   Thyroid function studies: No results for input(s): TSH, T4TOTAL, T3FREE, THYROIDAB in the last 72 hours.  Invalid input(s): FREET3 Anemia work up: No results for input(s): VITAMINB12, FOLATE, FERRITIN, TIBC, IRON, RETICCTPCT in the last 72 hours. Sepsis Labs: Recent Labs  Lab 11/02/20 1640 11/03/20 0321  WBC 6.0 6.2   Microbiology Recent Results (from the past 240 hour(s))  SARS CORONAVIRUS 2 (TAT 6-24 HRS) Nasopharyngeal Nasopharyngeal Swab     Status: None   Collection Time: 11/02/20 11:07 PM   Specimen: Nasopharyngeal Swab  Result Value Ref Range Status   SARS Coronavirus 2 NEGATIVE NEGATIVE Final    Comment: (NOTE) SARS-CoV-2 target nucleic acids are NOT DETECTED.  The SARS-CoV-2 RNA is generally detectable in upper and lower respiratory specimens during the acute phase of infection.  Negative results do not preclude SARS-CoV-2 infection, do not rule out co-infections with other pathogens, and should not be used as the sole basis for treatment or other patient management decisions. Negative results must be combined with clinical observations, patient history, and epidemiological information. The expected result is Negative.  Fact Sheet for Patients: HairSlick.no  Fact Sheet for Healthcare Providers: quierodirigir.com  This test is not yet approved or cleared by the [place] FDA and  has been authorized for detection and/or diagnosis of SARS-CoV-2 by FDA under an Emergency Use Authorization (EUA). This EUA will remain  in effect (meaning this test can be used) for the duration of the COVID-19 declaration under Se ction 564(b)(1) of the Act, 21 U.S.C. section 360bbb-3(b)(1), unless the authorization is terminated or revoked sooner.  Performed at [facility], [address] [place], [place] [address]      Medications:    clopidogrel  75 mg Oral Daily   gabapentin  800 mg Oral QID   heparin  5,000 Units Subcutaneous Q8H   insulin aspart  0-9 Units Subcutaneous TID WC   insulin glargine-yfgn  20 Units Subcutaneous Daily   lisinopril  10 mg Oral Daily   metoprolol succinate  50 mg Oral Daily   pantoprazole  40 mg Oral Daily   pravastatin  40 mg Oral q1800   Continuous Infusions:  sodium chloride 75 mL/hr at 11/03/20 0540      LOS: 0 days   Marinda Elk  [facility]  11/03/2020, 7:34 AM

## 2020-11-04 ENCOUNTER — Encounter (HOSPITAL_COMMUNITY): Payer: Self-pay | Admitting: Cardiovascular Disease

## 2020-11-04 ENCOUNTER — Encounter (HOSPITAL_COMMUNITY)
Admission: EM | Disposition: A | Discharge status: Home / Self Care | Payer: Self-pay | Source: Home / Self Care | Attending: Internal Medicine

## 2020-11-04 DIAGNOSIS — Z8673 Personal history of transient ischemic attack (TIA), and cerebral infarction without residual deficits: Secondary | ICD-10-CM | POA: Diagnosis not present

## 2020-11-04 DIAGNOSIS — I200 Unstable angina: Secondary | ICD-10-CM

## 2020-11-04 DIAGNOSIS — I25110 Atherosclerotic heart disease of native coronary artery with unstable angina pectoris: Secondary | ICD-10-CM | POA: Diagnosis not present

## 2020-11-04 DIAGNOSIS — N183 Chronic kidney disease, stage 3 unspecified: Secondary | ICD-10-CM | POA: Diagnosis not present

## 2020-11-04 HISTORY — PX: CORONARY STENT INTERVENTION: CATH118234

## 2020-11-04 LAB — BASIC METABOLIC PANEL
Anion gap: 6 (ref 5–15)
Anion gap: 8 (ref 5–15)
BUN: 17 mg/dL (ref 6–20)
BUN: 18 mg/dL (ref 6–20)
CO2: 20 mmol/L — ABNORMAL LOW (ref 22–32)
CO2: 19 mmol/L — ABNORMAL LOW (ref 22–32)
Calcium: 9.1 mg/dL (ref 8.9–10.3)
Calcium: 9.4 mg/dL (ref 8.9–10.3)
Chloride: 111 mmol/L (ref 98–111)
Chloride: 111 mmol/L (ref 98–111)
Creatinine, Ser: 1.6 mg/dL — ABNORMAL HIGH (ref 0.61–1.24)
Creatinine, Ser: 1.6 mg/dL — ABNORMAL HIGH (ref 0.61–1.24)
GFR, Estimated: 49 mL/min — ABNORMAL LOW (ref >60)
GFR, Estimated: 49 mL/min — ABNORMAL LOW (ref >60)
Glucose, Bld: 108 mg/dL — ABNORMAL HIGH (ref 70–99)
Glucose, Bld: 97 mg/dL (ref 70–99)
Potassium: 4.5 mmol/L (ref 3.5–5.1)
Potassium: 4.1 mmol/L (ref 3.5–5.1)
Sodium: 137 mmol/L (ref 135–145)
Sodium: 138 mmol/L (ref 135–145)

## 2020-11-04 LAB — POCT ACTIVATED CLOTTING TIME
Activated Clotting Time: 347 seconds
Activated Clotting Time: 341 seconds

## 2020-11-04 LAB — CBC
HCT: 42.5 % (ref 39.0–52.0)
Hemoglobin: 14.9 g/dL (ref 13.0–17.0)
MCH: 31.1 pg (ref 26.0–34.0)
MCHC: 35.1 g/dL (ref 30.0–36.0)
MCV: 88.7 fL (ref 80.0–100.0)
Platelets: 174 10*3/uL (ref 150–400)
RBC: 4.79 MIL/uL (ref 4.22–5.81)
RDW: 12.3 % (ref 11.5–15.5)
WBC: 6.3 10*3/uL (ref 4.0–10.5)
nRBC: 0 % (ref 0.0–0.2)

## 2020-11-04 LAB — GLUCOSE, CAPILLARY
Glucose-Capillary: 109 mg/dL — ABNORMAL HIGH (ref 70–99)
Glucose-Capillary: 92 mg/dL (ref 70–99)
Glucose-Capillary: 84 mg/dL (ref 70–99)
Glucose-Capillary: 215 mg/dL — ABNORMAL HIGH (ref 70–99)
Glucose-Capillary: 183 mg/dL — ABNORMAL HIGH (ref 70–99)

## 2020-11-04 SURGERY — CORONARY STENT INTERVENTION
Anesthesia: LOCAL

## 2020-11-04 MED ORDER — SODIUM CHLORIDE 0.9 % IV SOLN: 250.0000 mL | INTRAVENOUS | Status: DC | PRN

## 2020-11-04 MED ORDER — FENTANYL CITRATE (PF) 100 MCG/2ML IJ SOLN
INTRAMUSCULAR | Status: DC | PRN
  Administered 2020-11-04: 25 ug via INTRAVENOUS

## 2020-11-04 MED ORDER — HEPARIN SODIUM (PORCINE) 1000 UNIT/ML IJ SOLN
INTRAMUSCULAR | Status: AC
  Filled 2020-11-04: qty 1

## 2020-11-04 MED ORDER — VERAPAMIL HCL 2.5 MG/ML IV SOLN
INTRAVENOUS | Status: AC
  Filled 2020-11-04: qty 2

## 2020-11-04 MED ORDER — NITROGLYCERIN 1 MG/10 ML FOR IR/CATH LAB
INTRA_ARTERIAL | Status: AC
  Filled 2020-11-04: qty 10

## 2020-11-04 MED ORDER — HEPARIN SODIUM (PORCINE) 1000 UNIT/ML IJ SOLN
INTRAMUSCULAR | Status: DC | PRN
  Administered 2020-11-04: 8000 [IU] via INTRAVENOUS

## 2020-11-04 MED ORDER — POLYETHYLENE GLYCOL 3350 17 G PO PACK
17.0000 g | PACK | Freq: Two times a day (BID) | ORAL | Status: DC
  Administered 2020-11-04: 17 g via ORAL
  Filled 2020-11-04 (×2): qty 1

## 2020-11-04 MED ORDER — SODIUM CHLORIDE 0.9 % WEIGHT BASED INFUSION
3.0000 mL/kg/h | INTRAVENOUS | Status: DC
  Administered 2020-11-04: 3 mL/kg/h via INTRAVENOUS

## 2020-11-04 MED ORDER — IOHEXOL 350 MG/ML SOLN
INTRAVENOUS | Status: DC | PRN
  Administered 2020-11-04: 75 mL

## 2020-11-04 MED ORDER — LABETALOL HCL 5 MG/ML IV SOLN: 10.0000 mg | INTRAVENOUS | Status: AC | PRN

## 2020-11-04 MED ORDER — NITROGLYCERIN 1 MG/10 ML FOR IR/CATH LAB
INTRA_ARTERIAL | Status: DC | PRN
  Administered 2020-11-04: 200 ug via INTRACORONARY

## 2020-11-04 MED ORDER — HEPARIN (PORCINE) IN NACL 2000-0.9 UNIT/L-% IV SOLN
INTRAVENOUS | Status: DC | PRN
  Administered 2020-11-04: 1000 mL

## 2020-11-04 MED ORDER — SODIUM CHLORIDE 0.9% FLUSH: 3.0000 mL | INTRAVENOUS | Status: DC | PRN

## 2020-11-04 MED ORDER — VERAPAMIL HCL 2.5 MG/ML IV SOLN
INTRAVENOUS | Status: DC | PRN
  Administered 2020-11-04: 10 mL via INTRA_ARTERIAL

## 2020-11-04 MED ORDER — SODIUM CHLORIDE 0.9 % WEIGHT BASED INFUSION: 1.0000 mL/kg/h | INTRAVENOUS | Status: AC

## 2020-11-04 MED ORDER — LIDOCAINE HCL (PF) 1 % IJ SOLN
INTRAMUSCULAR | Status: DC | PRN
  Administered 2020-11-04: 4 mL

## 2020-11-04 MED ORDER — LIDOCAINE HCL (PF) 1 % IJ SOLN
INTRAMUSCULAR | Status: AC
  Filled 2020-11-04: qty 30

## 2020-11-04 MED ORDER — FENTANYL CITRATE (PF) 100 MCG/2ML IJ SOLN
INTRAMUSCULAR | Status: AC
  Filled 2020-11-04: qty 2

## 2020-11-04 MED ORDER — MIDAZOLAM HCL 2 MG/2ML IJ SOLN
INTRAMUSCULAR | Status: DC | PRN
  Administered 2020-11-04: 2 mg via INTRAVENOUS

## 2020-11-04 MED ORDER — SODIUM CHLORIDE 0.9% FLUSH: 3.0000 mL | Freq: Two times a day (BID) | INTRAVENOUS | Status: DC

## 2020-11-04 MED ORDER — MORPHINE SULFATE (PF) 2 MG/ML IV SOLN: 2.0000 mg | INTRAVENOUS | Status: DC | PRN

## 2020-11-04 MED ORDER — SODIUM CHLORIDE 0.9 % WEIGHT BASED INFUSION: 1.0000 mL/kg/h | INTRAVENOUS | Status: DC

## 2020-11-04 MED ORDER — MIDAZOLAM HCL 2 MG/2ML IJ SOLN
INTRAMUSCULAR | Status: AC
  Filled 2020-11-04: qty 2

## 2020-11-04 SURGICAL SUPPLY — 21 items
BALLN SAPPHIRE 2.5X20 (BALLOONS) ×2
BALLN SAPPHIRE ~~LOC~~ 3.0X15 (BALLOONS) ×1 IMPLANT
BALLOON SAPPHIRE 2.5X20 (BALLOONS) IMPLANT
CATH BALLN WEDGE 5F 110CM (CATHETERS) IMPLANT
CATH LAUNCHER 6FR AL.75 (CATHETERS) ×1 IMPLANT
DEVICE RAD COMP TR BAND LRG (VASCULAR PRODUCTS) ×1 IMPLANT
ELECT DEFIB PAD ADLT CADENCE (PAD) ×1 IMPLANT
GLIDESHEATH SLEND SS 6F .021 (SHEATH) ×1 IMPLANT
GUIDEWIRE INQWIRE 1.5J.035X260 (WIRE) IMPLANT
INQWIRE 1.5J .035X260CM (WIRE) ×2
KIT ENCORE 26 ADVANTAGE (KITS) ×1 IMPLANT
KIT HEART LEFT (KITS) ×2 IMPLANT
PACK CARDIAC CATHETERIZATION (CUSTOM PROCEDURE TRAY) ×2 IMPLANT
SHEATH GLIDE SLENDER 4/5FR (SHEATH) IMPLANT
STENT SYNERGY XD 2.75X48 (Permanent Stent) IMPLANT
STENT SYNERGY XD 3.0X38 (Permanent Stent) IMPLANT
SYNERGY XD 2.75X48 (Permanent Stent) ×2 IMPLANT
SYNERGY XD 3.0X38 (Permanent Stent) ×2 IMPLANT
TRANSDUCER W/STOPCOCK (MISCELLANEOUS) ×2 IMPLANT
TUBING CIL FLEX 10 FLL-RA (TUBING) ×2 IMPLANT
WIRE ASAHI PROWATER 180CM (WIRE) ×1 IMPLANT

## 2020-11-04 NOTE — Progress Notes (Signed)
Progress Note  Patient Name: Eduardo Weeks Date of Encounter: 11/04/2020  [facility] Cardiologist: None   Subjective   Did well overnight.  No bleeding at the radial site.  He is hungry  Inpatient Medications    Scheduled Meds:  [MAR Hold] aspirin  81 mg Oral Daily   [MAR Hold] carvedilol  12.5 mg Oral BID WC   [MAR Hold] clopidogrel  75 mg Oral Daily   [MAR Hold] gabapentin  800 mg Oral QID   [MAR Hold] heparin  5,000 Units Subcutaneous Q8H   [MAR Hold] insulin aspart  0-9 Units Subcutaneous TID WC   [MAR Hold] insulin glargine-yfgn  20 Units Subcutaneous Daily   [MAR Hold] lisinopril  10 mg Oral Daily   [MAR Hold] pantoprazole  40 mg Oral Daily   [MAR Hold] pravastatin  40 mg Oral q1800   [MAR Hold] sodium chloride flush  3 mL Intravenous Q12H   sodium chloride flush  3 mL Intravenous Q12H   Continuous Infusions:  [MAR Hold] sodium chloride     sodium chloride     sodium chloride     PRN Meds: [MAR Hold] sodium chloride, sodium chloride, [MAR Hold] acetaminophen, fentaNYL, Heparin (Porcine) in NaCl, heparin sodium (porcine), [MAR Hold] hydrALAZINE, lidocaine (PF), midazolam, nitroGLYCERIN, [MAR Hold] ondansetron (ZOFRAN) IV, Radial Cocktail/Verapamil only, [MAR Hold] sodium chloride flush, sodium chloride flush   Vital Signs    Vitals:   11/03/20 2322 11/04/20 0424 11/04/20 0721 11/04/20 0900  BP: (!) 169/104 (!) 152/97 (!) 170/101 (!) 165/97  Pulse: 64 66 61   Resp: 13 18 17    Temp: 98 F (36.7 C) 98 F (36.7 C) 97.6 F (36.4 C)   TempSrc: Oral Oral Oral   SpO2: 100% 100% 99%   Weight:      Height:        Intake/Output Summary (Last 24 hours) at 11/04/2020 1133 Last data filed at 11/04/2020 0926 Gross per 24 hour  Intake 100 ml  Output 350 ml  Net -250 ml   Last 3 Weights 11/03/2020 03/11/2012 07/11/2011  Weight (lbs) 187 lb 170 lb 165 lb  Weight (kg) 84.823 kg 77.111 kg 74.844 kg      Telemetry    Normal sinus rhythm- Personally  Reviewed  ECG      Physical Exam   GEN: No acute distress.   Neck: No JVD Cardiac: RRR, no murmurs, rubs, or gallops.  Respiratory: Clear to auscultation bilaterally. GI: Soft, nontender, non-distended  MS: No edema; No deformity.  No right radial hematoma Neuro:  Nonfocal  Psych: Normal affect   Labs    High Sensitivity Troponin:   Recent Labs  Lab 11/02/20 1640 11/03/20 0321 11/03/20 0625  TROPONINIHS 23* 17 19*     Chemistry Recent Labs  Lab 11/02/20 1640 11/03/20 0321 11/04/20 0059 11/04/20 0501  NA 138 136 137 138  K 4.3 4.3 4.5 4.1  CL 110 107 111 111  CO2 21* 21* 20* 19*  GLUCOSE 123* 260* 108* 97  BUN 18 18 17 18   CREATININE 2.11* 1.91* 1.60* 1.60*  CALCIUM 9.6 9.2 9.1 9.4  PROT 6.5  --   --   --   ALBUMIN 3.5  --   --   --   AST 22  --   --   --   ALT 17  --   --   --   ALKPHOS 57  --   --   --   BILITOT 0.4  --   --   --  GFRNONAA 35* 40* 49* 49*  ANIONGAP 7 8 6 8     Lipids  Recent Labs  Lab 11/03/20 0321  CHOL 122  TRIG 71  HDL 38*  LDLCALC 70  CHOLHDL 3.2    Hematology Recent Labs  Lab 11/02/20 1640 11/03/20 0321 11/04/20 0501  WBC 6.0 6.2 6.3  RBC 4.80 4.82 4.79  HGB 15.1 15.1 14.9  HCT 43.4 43.8 42.5  MCV 90.4 90.9 88.7  MCH 31.5 31.3 31.1  MCHC 34.8 34.5 35.1  RDW 12.4 12.5 12.3  PLT 190 169 174   Thyroid No results for input(s): TSH, FREET4 in the last 168 hours.  BNPNo results for input(s): BNP, PROBNP in the last 168 hours.  DDimer  Recent Labs  Lab 11/02/20 2029  DDIMER 0.69*     Radiology    DG Chest 2 View  Result Date: 11/02/2020 CLINICAL DATA:  Chest pain. EXAM: CHEST - 2 VIEW COMPARISON:  July 14, 2020 FINDINGS: The heart size and mediastinal contours are within normal limits. Both lungs are clear. The visualized skeletal structures are unremarkable. IMPRESSION: No active cardiopulmonary disease. Electronically Signed   By: July 16, 2020 M.D.   On: 11/02/2020 17:55   CARDIAC  CATHETERIZATION  Result Date: 11/03/2020   Prox Cx to Dist Cx lesion is 100% stenosed.   Dist LAD lesion is 80% stenosed.   2nd Diag lesion is 90% stenosed.   1st Diag lesion is 80% stenosed.   Prox RCA lesion is 90% stenosed.   Mid RCA lesion is 60% stenosed.   Dist RCA lesion is 70% stenosed.   RPDA lesion is 80% stenosed. 1.  Significant three-vessel coronary artery disease with chronic occlusion of the mid left circumflex with minimal collaterals to the OM branches, significant diffuse disease affecting a large first diagonal and severe diffuse disease affecting the right coronary artery from the proximal segment all the way to the PDA/PLA bifurcation with significant right PDA disease. 2.  Left ventricular angiography was not performed due to chronic kidney disease.  Mildly elevated left ventricular end-diastolic pressure. Recommendations: Difficult management options.  CABG is not a good option given that the LAD itself is not significantly disease and the OM branches are not graftable.  His best option is to proceed with right coronary artery PCI and overlapped stents placement.  We will need to treat all the way distally to the proximal segment with overlapped stents.  PCI of the diagonal can then be staged to be done outpatient to minimize risk of contrast-induced nephropathy.  I discussed the case with Dr. 11/05/2020.  I will hydrate him today and recheck renal function tomorrow.   ECHOCARDIOGRAM COMPLETE  Result Date: 11/03/2020    ECHOCARDIOGRAM REPORT   Patient Name:   Eduardo Weeks Date of Exam: 11/03/2020 Medical Rec #:  11/05/2020          Height:       69.0 in Accession #:    [id number]         Weight:       187.0 lb Date of Birth:  03-Aug-1960         BSA:          2.008 m Patient Age:    60 years           BP:           153/101 mmHg Patient Gender: M                  HR:  66 bpm. Exam Location:  Inpatient Procedure: 2D Echo, Cardiac Doppler and Color Doppler Indications:    R07.9*  Chest pain, unspecified  History:        Patient has prior history of Echocardiogram examinations, most                 recent 07/15/2020. CAD, Signs/Symptoms:Chest Pain; Risk                 Factors:Hypertension.  Sonographer:    MH Referring Phys: [id number] Cyndi Bender IMPRESSIONS  1. Left ventricular ejection fraction, by estimation, is 55 to 60%. The left ventricle has normal function. The left ventricle has no definite regional wall motion abnormalities. There is moderate left ventricular hypertrophy. Left ventricular diastolic  parameters were normal.  2. Right ventricular systolic function is normal. The right ventricular size is normal. Tricuspid regurgitation signal is inadequate for assessing PA pressure.  3. The mitral valve is grossly normal/mildly degenerative. Mild to moderate mitral valve regurgitation. No evidence of mitral stenosis.  4. The aortic valve is tricuspid. Aortic valve regurgitation is not visualized. No aortic stenosis is present.  5. The inferior vena cava is normal in size with greater than 50% respiratory variability, suggesting right atrial pressure of 3 mmHg. FINDINGS  Left Ventricle: Left ventricular ejection fraction, by estimation, is 55 to 60%. The left ventricle has normal function. The left ventricle has no regional wall motion abnormalities. The left ventricular internal cavity size was normal in size. There is  moderate left ventricular hypertrophy. Left ventricular diastolic parameters were normal. Right Ventricle: The right ventricular size is normal. No increase in right ventricular wall thickness. Right ventricular systolic function is normal. Tricuspid regurgitation signal is inadequate for assessing PA pressure. Left Atrium: Left atrial size was normal in size. Right Atrium: Right atrial size was normal in size. Pericardium: There is no evidence of pericardial effusion. Mitral Valve: The mitral valve is grossly normal. Mild to moderate mitral valve regurgitation. No evidence  of mitral valve stenosis. Tricuspid Valve: The tricuspid valve is normal in structure. Tricuspid valve regurgitation is trivial. No evidence of tricuspid stenosis. Aortic Valve: The aortic valve is tricuspid. Aortic valve regurgitation is not visualized. No aortic stenosis is present. Aortic valve mean gradient measures 3.0 mmHg. Aortic valve peak gradient measures 5.2 mmHg. Aortic valve area, by VTI measures 1.91 cm. Pulmonic Valve: The pulmonic valve was not well visualized. Pulmonic valve regurgitation is trivial. No evidence of pulmonic stenosis. Aorta: The aortic root is normal in size and structure. Venous: The inferior vena cava is normal in size with greater than 50% respiratory variability, suggesting right atrial pressure of 3 mmHg. IAS/Shunts: No atrial level shunt detected by color flow Doppler.  LEFT VENTRICLE PLAX 2D LVIDd:         3.70 cm  Diastology LVIDs:         2.50 cm  LV e' medial:    7.07 cm/s LV PW:         1.40 cm  LV E/e' medial:  12.2 LV IVS:        1.40 cm  LV e' lateral:   4.79 cm/s LVOT diam:     1.90 cm  LV E/e' lateral: 18.0 LV SV:         48 LV SV Index:   24 LVOT Area:     2.84 cm  RIGHT VENTRICLE            IVC RV S prime:  8.38 cm/s  IVC diam: 1.70 cm TAPSE (M-mode): 1.6 cm LEFT ATRIUM             Index       RIGHT ATRIUM          Index LA diam:        2.60 cm 1.29 cm/m  RA Area:     8.30 cm LA Vol (A2C):   44.1 ml 21.96 ml/m RA Volume:   14.90 ml 7.42 ml/m LA Vol (A4C):   39.2 ml 19.52 ml/m LA Biplane Vol: 41.4 ml 20.62 ml/m  AORTIC VALVE                   PULMONIC VALVE AV Area (Vmax):    2.06 cm    PV Vmax:       0.51 m/s AV Area (Vmean):   1.86 cm    PV Peak grad:  1.1 mmHg AV Area (VTI):     1.91 cm AV Vmax:           114.00 cm/s AV Vmean:          82.100 cm/s AV VTI:            0.249 m AV Peak Grad:      5.2 mmHg AV Mean Grad:      3.0 mmHg LVOT Vmax:         82.90 cm/s LVOT Vmean:        53.900 cm/s LVOT VTI:          0.168 m LVOT/AV VTI ratio: 0.67  AORTA Ao  Root diam: 3.50 cm Ao Asc diam:  3.40 cm MITRAL VALVE MV Area (PHT): 2.65 cm    SHUNTS MR Peak grad: 123.2 mmHg   Systemic VTI:  0.17 m MR Vmax:      555.00 cm/s  Systemic Diam: 1.90 cm MV E velocity: 86.10 cm/s MV A velocity: 74.10 cm/s MV E/A ratio:  1.16 Weston Brass MD Electronically signed by Weston Brass MD Signature Date/Time: 11/03/2020/5:40:08 PM    Final     Cardiac Studies   Cath films reviewed with long area of RCA disease.  Long area of disease in the diagonal.  Patient Profile     60 y.o. male CAD  Assessment & Plan    CAD: Plan for PCI of RCA today.  CKD is stable.  Creatinine has improved.  Try to minimize dye.  I think his RCA is the culprit lesion.  The diagonal stenosis can be managed at a later time.  I suspect he will need post procedure hydration and hopefully will be able to go home tomorrow.  He needs to avoid smoking and other tobacco products.     For questions or updates, please contact [facility] Please consult [email or web address] for contact info under        Signed, Lance Muss, MD  11/04/2020, 11:33 AM

## 2020-11-04 NOTE — Progress Notes (Addendum)
[facility] PROGRESS NOTE    Progress Note  Eduardo Weeks  DJS:[id number] DOB: 06-May-1960 DOA: 11/02/2020 PCP: Primus Bravo, NP     Brief Narrative:   Eduardo Weeks is an 60 y.o. 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  She has had a previous stress test in 2020 showed no inducible reversible ischemia    Assessment/Plan:   Typical Chest pain: Cardiac cath showed significant triple-vessel disease with PCI to  tRCA Continue Plavix and Coreg he is scheduled for catheter 11/04/2020. Renal function has remained stable.  Essential hypertension: Continue Coreg and ACE inhibitor.  Pressure is mildly elevated Her blood pressure is improved now continue current regimen IV hydralazine as needed.  Chronic kidney disease stage IIIb:  With a baseline creatinine around 1.8-2.  Insulin-dependent diabetes mellitus type 2: With a last A1c of 7.7, continue long-acting insulin per sliding scale. Continue to hold oral hypoglycemic agents.  History of CVA: Continue Plavix and statins.    DVT prophylaxis: lovenox Family Communication:none Status is: Observation  The patient remains OBS appropriate and will d/c before 2 midnights.  Dispo: The patient is from: Home              Anticipated d/c is to: Home              Patient currently is not medically stable to d/c.   Difficult to place patient No     Code Status:     Code Status Orders  (From admission, onward)           Start     Ordered   11/02/20 2236  Full code  Continuous        11/02/20 2237           Code Status History      Date Active Date Inactive Code Status Order ID Comments User Context   07/15/2020 0008 07/16/2020 1652 Full Code [id number]  Rometta Emery, MD ED         IV Access:   Peripheral IV   Procedures and diagnostic studies:   DG Chest 2 View  Result Date: 11/02/2020 CLINICAL DATA:  Chest pain. EXAM: CHEST - 2 VIEW COMPARISON:  July 14, 2020 FINDINGS: The heart size and mediastinal contours are within normal limits. Both lungs are clear. The visualized skeletal structures are unremarkable. IMPRESSION: No active cardiopulmonary disease. Electronically Signed   By: Aram Candela M.D.   On: 11/02/2020 17:55   CARDIAC CATHETERIZATION  Result Date: 11/03/2020   Prox Cx to Dist Cx lesion is 100% stenosed.   Dist LAD lesion is 80% stenosed.   2nd Diag lesion is 90% stenosed.   1st Diag lesion is 80% stenosed.   Prox RCA lesion is 90% stenosed.   Mid RCA lesion is 60% stenosed.   Dist RCA lesion is 70% stenosed.   RPDA lesion is 80% stenosed. 1.  Significant three-vessel coronary artery disease with chronic occlusion of the mid left circumflex with minimal collaterals to the OM branches, significant diffuse disease affecting a large first diagonal and severe diffuse disease affecting the right coronary artery from the proximal segment all the way  to the PDA/PLA bifurcation with significant right PDA disease. 2.  Left ventricular angiography was not performed due to chronic kidney disease.  Mildly elevated left ventricular end-diastolic pressure. Recommendations: Difficult management options.  CABG is not a good option given that the LAD itself is not significantly disease and the OM branches are not graftable.  His best option is to proceed with right coronary artery PCI and overlapped stents placement.  We will need to treat all the way distally to the proximal segment with overlapped stents.  PCI of the diagonal can then be staged to be done outpatient to minimize risk of contrast-induced nephropathy.   I discussed the case with Dr. Eldridge Dace.  I will hydrate him today and recheck renal function tomorrow.   ECHOCARDIOGRAM COMPLETE  Result Date: 11/03/2020    ECHOCARDIOGRAM REPORT   Patient Name:   Eduardo Weeks Date of Exam: 11/03/2020 Medical Rec #:  [id number]          Height:       69.0 in Accession #:    [id number]         Weight:       187.0 lb Date of Birth:  07-Oct-1960         BSA:          2.008 m Patient Age:    59 years           BP:           153/101 mmHg Patient Gender: M                  HR:           66 bpm. Exam Location:  Inpatient Procedure: 2D Echo, Cardiac Doppler and Color Doppler Indications:    R07.9* Chest pain, unspecified  History:        Patient has prior history of Echocardiogram examinations, most                 recent 07/15/2020. CAD, Signs/Symptoms:Chest Pain; Risk                 Factors:Hypertension.  Sonographer:    MH Referring Phys: [id number] Cyndi Bender IMPRESSIONS  1. Left ventricular ejection fraction, by estimation, is 55 to 60%. The left ventricle has normal function. The left ventricle has no definite regional wall motion abnormalities. There is moderate left ventricular hypertrophy. Left ventricular diastolic  parameters were normal.  2. Right ventricular systolic function is normal. The right ventricular size is normal. Tricuspid regurgitation signal is inadequate for assessing PA pressure.  3. The mitral valve is grossly normal/mildly degenerative. Mild to moderate mitral valve regurgitation. No evidence of mitral stenosis.  4. The aortic valve is tricuspid. Aortic valve regurgitation is not visualized. No aortic stenosis is present.  5. The inferior vena cava is normal in size with greater than 50% respiratory variability, suggesting right atrial pressure of 3 mmHg. FINDINGS  Left Ventricle: Left ventricular ejection fraction, by estimation, is 55 to 60%. The left ventricle has normal function. The left ventricle has no regional wall motion abnormalities. The left  ventricular internal cavity size was normal in size. There is  moderate left ventricular hypertrophy. Left ventricular diastolic parameters were normal. Right Ventricle: The right ventricular size is normal. No increase in right ventricular wall thickness. Right ventricular systolic function is normal. Tricuspid regurgitation signal is inadequate for assessing PA pressure. Left Atrium: Left atrial size was normal in size. Right Atrium: Right atrial size was normal in  size. Pericardium: There is no evidence of pericardial effusion. Mitral Valve: The mitral valve is grossly normal. Mild to moderate mitral valve regurgitation. No evidence of mitral valve stenosis. Tricuspid Valve: The tricuspid valve is normal in structure. Tricuspid valve regurgitation is trivial. No evidence of tricuspid stenosis. Aortic Valve: The aortic valve is tricuspid. Aortic valve regurgitation is not visualized. No aortic stenosis is present. Aortic valve mean gradient measures 3.0 mmHg. Aortic valve peak gradient measures 5.2 mmHg. Aortic valve area, by VTI measures 1.91 cm. Pulmonic Valve: The pulmonic valve was not well visualized. Pulmonic valve regurgitation is trivial. No evidence of pulmonic stenosis. Aorta: The aortic root is normal in size and structure. Venous: The inferior vena cava is normal in size with greater than 50% respiratory variability, suggesting right atrial pressure of 3 mmHg. IAS/Shunts: No atrial level shunt detected by color flow Doppler.  LEFT VENTRICLE PLAX 2D LVIDd:         3.70 cm  Diastology LVIDs:         2.50 cm  LV e' medial:    7.07 cm/s LV PW:         1.40 cm  LV E/e' medial:  12.2 LV IVS:        1.40 cm  LV e' lateral:   4.79 cm/s LVOT diam:     1.90 cm  LV E/e' lateral: 18.0 LV SV:         48 LV SV Index:   24 LVOT Area:     2.84 cm  RIGHT VENTRICLE            IVC RV S prime:     8.38 cm/s  IVC diam: 1.70 cm TAPSE (M-mode): 1.6 cm LEFT ATRIUM             Index       RIGHT ATRIUM          Index LA diam:         2.60 cm 1.29 cm/m  RA Area:     8.30 cm LA Vol (A2C):   44.1 ml 21.96 ml/m RA Volume:   14.90 ml 7.42 ml/m LA Vol (A4C):   39.2 ml 19.52 ml/m LA Biplane Vol: 41.4 ml 20.62 ml/m  AORTIC VALVE                   PULMONIC VALVE AV Area (Vmax):    2.06 cm    PV Vmax:       0.51 m/s AV Area (Vmean):   1.86 cm    PV Peak grad:  1.1 mmHg AV Area (VTI):     1.91 cm AV Vmax:           114.00 cm/s AV Vmean:          82.100 cm/s AV VTI:            0.249 m AV Peak Grad:      5.2 mmHg AV Mean Grad:      3.0 mmHg LVOT Vmax:         82.90 cm/s LVOT Vmean:        53.900 cm/s LVOT VTI:          0.168 m LVOT/AV VTI ratio: 0.67  AORTA Ao Root diam: 3.50 cm Ao Asc diam:  3.40 cm MITRAL VALVE MV Area (PHT): 2.65 cm    SHUNTS MR Peak grad: 123.2 mmHg   Systemic VTI:  0.17 m MR Vmax:      555.00 cm/s  Systemic Diam: 1.90 cm MV E velocity: 86.10 cm/s MV A velocity: 74.10 cm/s MV E/A ratio:  1.16 Weston Brass MD Electronically signed by Weston Brass MD Signature Date/Time: 11/03/2020/5:40:08 PM    Final      Medical Consultants:   None.   Subjective:    Teddrick Mallari no recurrent chest pain little bit anxious this morning.  Objective:    Vitals:   11/03/20 1900 11/03/20 2322 11/04/20 0424 11/04/20 0721  BP: (!) 149/95 (!) 169/104 (!) 152/97 (!) 170/101  Pulse:  64 66 61  Resp: 16 13 18 17   Temp: (!) 97.5 F (36.4 C) 98 F (36.7 C) 98 F (36.7 C) 97.6 F (36.4 C)  TempSrc: Oral Oral Oral Oral  SpO2: 100% 100% 100% 99%  Weight:      Height:       SpO2: 99 % O2 Flow Rate (L/min): 2 L/min   Intake/Output Summary (Last 24 hours) at 11/04/2020 0803 Last data filed at 11/04/2020 0427 Gross per 24 hour  Intake 871.25 ml  Output 350 ml  Net 521.25 ml   Filed Weights   11/03/20 0541  Weight: 84.8 kg    Exam: General exam: In no acute distress. Respiratory system: Good air movement and clear to auscultation. Cardiovascular system: S1 & S2 heard, RRR. No JVD. Gastrointestinal  system: Abdomen is nondistended, soft and nontender.  Extremities: No pedal edema. Skin: No rashes, lesions or ulcers Psychiatry: Judgement and insight appear normal. Mood & affect appropriate. Data Reviewed:    Labs: Basic Metabolic Panel: Recent Labs  Lab 11/02/20 1640 11/03/20 0321 11/04/20 0059 11/04/20 0501  NA 138 136 137 138  K 4.3 4.3 4.5 4.1  CL 110 107 111 111  CO2 21* 21* 20* 19*  GLUCOSE 123* 260* 108* 97  BUN 18 18 17 18   CREATININE 2.11* 1.91* 1.60* 1.60*  CALCIUM 9.6 9.2 9.1 9.4    GFR Estimated Creatinine Clearance: 49.7 mL/min (A) (by C-G formula based on SCr of 1.6 mg/dL (H)). Liver Function Tests: Recent Labs  Lab 11/02/20 1640  AST 22  ALT 17  ALKPHOS 57  BILITOT 0.4  PROT 6.5  ALBUMIN 3.5    No results for input(s): LIPASE, AMYLASE in the last 168 hours. No results for input(s): AMMONIA in the last 168 hours. Coagulation profile No results for input(s): INR, PROTIME in the last 168 hours. COVID-19 Labs  Recent Labs    11/02/20 2029  DDIMER 0.69*     Lab Results  Component Value Date   SARSCOV2NAA NEGATIVE 11/02/2020   SARSCOV2NAA NEGATIVE 07/14/2020    CBC: Recent Labs  Lab 11/02/20 1640 11/03/20 0321 11/04/20 0501  WBC 6.0 6.2 6.3  HGB 15.1 15.1 14.9  HCT 43.4 43.8 42.5  MCV 90.4 90.9 88.7  PLT 190 169 174    Cardiac Enzymes: No results for input(s): CKTOTAL, CKMB, CKMBINDEX, TROPONINI in the last 168 hours. BNP (last 3 results) No results for input(s): PROBNP in the last 8760 hours. CBG: Recent Labs  Lab 11/03/20 1231 11/03/20 1518 11/03/20 1838 11/03/20 2107 11/04/20 0616  GLUCAP 101* 71 194* 189* 109*   D-Dimer: Recent Labs    11/02/20 2029  DDIMER 0.69*    Hgb A1c: No results for input(s): HGBA1C in the last 72 hours. Lipid Profile: Recent Labs    11/03/20 0321  CHOL 122  HDL 38*  LDLCALC 70  TRIG 71  CHOLHDL 3.2    Thyroid function studies: No results for input(s): TSH, T4TOTAL,  T3FREE, THYROIDAB in the last 72 hours.  Invalid input(s): FREET3 Anemia work up: No results for input(s): VITAMINB12, FOLATE, FERRITIN, TIBC, IRON, RETICCTPCT in the last 72 hours. Sepsis Labs: Recent Labs  Lab 11/02/20 1640 11/03/20 0321 11/04/20 0501  WBC 6.0 6.2 6.3    Microbiology Recent Results (from the past 240 hour(s))  SARS CORONAVIRUS 2 (TAT 6-24 HRS) Nasopharyngeal Nasopharyngeal Swab     Status: None   Collection Time: 11/02/20 11:07 PM   Specimen: Nasopharyngeal Swab  Result Value Ref Range Status   SARS Coronavirus 2 NEGATIVE NEGATIVE Final    Comment: (NOTE) SARS-CoV-2 target nucleic acids are NOT DETECTED.  The SARS-CoV-2 RNA is generally detectable in upper and lower respiratory specimens during the acute phase of infection. Negative results do not preclude SARS-CoV-2 infection, do not rule out co-infections with other pathogens, and should not be used as the sole basis for treatment or other patient management decisions. Negative results must be combined with clinical observations, patient history, and epidemiological information. The expected result is Negative.  Fact Sheet for Patients: HairSlick.no  Fact Sheet for Healthcare Providers: quierodirigir.com  This test is not yet approved or cleared by the [place] FDA and  has been authorized for detection and/or diagnosis of SARS-CoV-2 by FDA under an Emergency Use Authorization (EUA). This EUA will remain  in effect (meaning this test can be used) for the duration of the COVID-19 declaration under Se ction 564(b)(1) of the Act, 21 U.S.C. section 360bbb-3(b)(1), unless the authorization is terminated or revoked sooner.  Performed at [facility], [address], [place], [place] [address]      Medications:    aspirin  81 mg Oral Daily   carvedilol  12.5 mg Oral BID WC   clopidogrel  75 mg Oral Daily   gabapentin  800 mg Oral  QID   heparin  5,000 Units Subcutaneous Q8H   insulin aspart  0-9 Units Subcutaneous TID WC   insulin glargine-yfgn  20 Units Subcutaneous Daily   lisinopril  10 mg Oral Daily   pantoprazole  40 mg Oral Daily   pravastatin  40 mg Oral q1800   sodium chloride flush  3 mL Intravenous Q12H   sodium chloride flush  3 mL Intravenous Q12H   Continuous Infusions:  sodium chloride     sodium chloride     sodium chloride        LOS: 1 day   Marinda Elk  [facility]  11/04/2020, 8:03 AM

## 2020-11-04 NOTE — Interval H&P Note (Signed)
History and Physical Interval Note:  11/04/2020 10:30 AM  Eduardo Weeks  has presented today for surgery, with the diagnosis of CAD.  The various methods of treatment have been discussed with the patient and family. After consideration of risks, benefits and other options for treatment, the patient has consented to  Procedure(s): CORONARY STENT INTERVENTION (N/A) as a surgical intervention.  The patient's history has been reviewed, patient examined, no change in status, stable for surgery.  I have reviewed the patient's chart and labs.  Questions were answered to the patient's satisfaction.     Bryan Lemma

## 2020-11-05 ENCOUNTER — Encounter (HOSPITAL_COMMUNITY): Payer: Self-pay | Admitting: Cardiology

## 2020-11-05 ENCOUNTER — Other Ambulatory Visit (HOSPITAL_COMMUNITY): Payer: Self-pay

## 2020-11-05 DIAGNOSIS — I152 Hypertension secondary to endocrine disorders: Secondary | ICD-10-CM | POA: Diagnosis not present

## 2020-11-05 DIAGNOSIS — Z955 Presence of coronary angioplasty implant and graft: Secondary | ICD-10-CM

## 2020-11-05 DIAGNOSIS — N183 Chronic kidney disease, stage 3 unspecified: Secondary | ICD-10-CM | POA: Diagnosis not present

## 2020-11-05 DIAGNOSIS — E1159 Type 2 diabetes mellitus with other circulatory complications: Secondary | ICD-10-CM | POA: Diagnosis not present

## 2020-11-05 LAB — CBC
HCT: 38.2 % — ABNORMAL LOW (ref 39.0–52.0)
Hemoglobin: 13.2 g/dL (ref 13.0–17.0)
MCH: 31.3 pg (ref 26.0–34.0)
MCHC: 34.6 g/dL (ref 30.0–36.0)
MCV: 90.5 fL (ref 80.0–100.0)
Platelets: 152 10*3/uL (ref 150–400)
RBC: 4.22 MIL/uL (ref 4.22–5.81)
RDW: 12.4 % (ref 11.5–15.5)
WBC: 5.5 10*3/uL (ref 4.0–10.5)
nRBC: 0 % (ref 0.0–0.2)

## 2020-11-05 LAB — BASIC METABOLIC PANEL
Anion gap: 5 (ref 5–15)
BUN: 14 mg/dL (ref 6–20)
CO2: 20 mmol/L — ABNORMAL LOW (ref 22–32)
Calcium: 8.7 mg/dL — ABNORMAL LOW (ref 8.9–10.3)
Chloride: 109 mmol/L (ref 98–111)
Creatinine, Ser: 1.49 mg/dL — ABNORMAL HIGH (ref 0.61–1.24)
GFR, Estimated: 54 mL/min — ABNORMAL LOW (ref >60)
Glucose, Bld: 229 mg/dL — ABNORMAL HIGH (ref 70–99)
Potassium: 4.2 mmol/L (ref 3.5–5.1)
Sodium: 134 mmol/L — ABNORMAL LOW (ref 135–145)

## 2020-11-05 LAB — GLUCOSE, CAPILLARY
Glucose-Capillary: 151 mg/dL — ABNORMAL HIGH (ref 70–99)
Glucose-Capillary: 189 mg/dL — ABNORMAL HIGH (ref 70–99)

## 2020-11-05 MED ORDER — CLOPIDOGREL BISULFATE 75 MG PO TABS
75.0000 mg | ORAL_TABLET | Freq: Every day | ORAL | 3 refills | Status: AC
  Filled 2020-11-05: qty 90, 90d supply, fill #0

## 2020-11-05 MED ORDER — METOPROLOL SUCCINATE ER 50 MG PO TB24
50.0000 mg | ORAL_TABLET | Freq: Every day | ORAL | 3 refills | Status: DC
  Filled 2020-11-05: qty 90, 90d supply, fill #0

## 2020-11-05 MED ORDER — ROSUVASTATIN CALCIUM 40 MG PO TABS
40.0000 mg | ORAL_TABLET | Freq: Every day | ORAL | 0 refills | Status: DC
  Filled 2020-11-05: qty 90, 90d supply, fill #0

## 2020-11-05 MED ORDER — CARVEDILOL 12.5 MG PO TABS
12.5000 mg | ORAL_TABLET | Freq: Two times a day (BID) | ORAL | 3 refills | Status: DC
  Filled 2020-11-05: qty 90, 45d supply, fill #0

## 2020-11-05 MED ORDER — ASPIRIN 81 MG PO CHEW
81.0000 mg | CHEWABLE_TABLET | Freq: Every day | ORAL | 0 refills | Status: DC
  Filled 2020-11-05: qty 90, 90d supply, fill #0

## 2020-11-05 MED ORDER — ROSUVASTATIN CALCIUM 20 MG PO TABS: 40.0000 mg | ORAL_TABLET | Freq: Every day | ORAL | Status: DC

## 2020-11-05 NOTE — TOC Transition Note (Signed)
Transition of Care [facility]) - CM/SW Discharge Note   Patient Details  Name: Jerian Morais MRN: [id number] Date of Birth: [birth year]  [facility] CM/SW Contact:  Terrial Rhodes, LCSWA Phone Number: 11/05/2020, 10:50 AM   Clinical Narrative:     Patient will DC to: Home   Anticipated DC date: 11/05/2020  Family notified: Patient declined  Transport by: Maretta Los  ?  Per MD patient ready for DC to home . RN, and patient, notified of DC. [facility] transport requested for patient.  CSW signing off.         Patient Goals and CMS Choice        Discharge Placement                       Discharge Plan and Services                                     Social Determinants of Health (SDOH) Interventions     Readmission Risk Interventions No flowsheet data found.

## 2020-11-05 NOTE — H&P (View-Only) (Signed)
Progress Note  Patient Name: Eduardo Weeks Date of Encounter: 11/05/2020  [facility] Cardiologist: None   Subjective   Feeling well this morning.   Inpatient Medications    Scheduled Meds:  aspirin  81 mg Oral Daily   carvedilol  12.5 mg Oral BID WC   clopidogrel  75 mg Oral Daily   gabapentin  800 mg Oral QID   heparin  5,000 Units Subcutaneous Q8H   insulin aspart  0-9 Units Subcutaneous TID WC   insulin glargine-yfgn  20 Units Subcutaneous Daily   lisinopril  10 mg Oral Daily   pantoprazole  40 mg Oral Daily   polyethylene glycol  17 g Oral BID   pravastatin  40 mg Oral q1800   sodium chloride flush  3 mL Intravenous Q12H   sodium chloride flush  3 mL Intravenous Q12H   Continuous Infusions:  sodium chloride     sodium chloride     PRN Meds: sodium chloride, sodium chloride, acetaminophen, hydrALAZINE, morphine injection, ondansetron (ZOFRAN) IV, sodium chloride flush, sodium chloride flush   Vital Signs    Vitals:   11/05/20 0036 11/05/20 0344 11/05/20 0400 11/05/20 0752  BP: (!) 174/100 (!) 181/106 (!) 177/107 112/82  Pulse: 61 61  73  Resp: 18 18    Temp: (!) 97.4 F (36.3 C) (!) 97.3 F (36.3 C)    TempSrc: Oral Oral    SpO2: 99% 99%    Weight:      Height:        Intake/Output Summary (Last 24 hours) at 11/05/2020 0901 Last data filed at 11/05/2020 0035 Gross per 24 hour  Intake 340 ml  Output 1100 ml  Net -760 ml   Last 3 Weights 11/04/2020 11/03/2020 03/11/2012  Weight (lbs) 179 lb 10.8 oz 187 lb 170 lb  Weight (kg) 81.5 kg 84.823 kg 77.111 kg      Telemetry    SR - Personally Reviewed  ECG    NSR 62 bpm, continued on TWI in inferolateral leads - Personally Reviewed  Physical Exam   GEN: No acute distress.   Neck: No JVD Cardiac: RRR, no murmurs, rubs, or gallops.  Respiratory: Clear to auscultation bilaterally. GI: Soft, nontender, non-distended  MS: No edema; No deformity. Right radial cath site stable. Neuro:  Nonfocal   Psych: Normal affect   Labs    High Sensitivity Troponin:   Recent Labs  Lab 11/02/20 1640 11/03/20 0321 11/03/20 0625  TROPONINIHS 23* 17 19*     Chemistry Recent Labs  Lab 11/02/20 1640 11/03/20 0321 11/04/20 0059 11/04/20 0501 11/04/20 2351  NA 138   < > 137 138 134*  K 4.3   < > 4.5 4.1 4.2  CL 110   < > 111 111 109  CO2 21*   < > 20* 19* 20*  GLUCOSE 123*   < > 108* 97 229*  BUN 18   < > 17 18 14   CREATININE 2.11*   < > 1.60* 1.60* 1.49*  CALCIUM 9.6   < > 9.1 9.4 8.7*  PROT 6.5  --   --   --   --   ALBUMIN 3.5  --   --   --   --   AST 22  --   --   --   --   ALT 17  --   --   --   --   ALKPHOS 57  --   --   --   --  BILITOT 0.4  --   --   --   --   GFRNONAA 35*   < > 49* 49* 54*  ANIONGAP 7   < > 6 8 5    < > = values in this interval not displayed.    Lipids  Recent Labs  Lab 11/03/20 0321  CHOL 122  TRIG 71  HDL 38*  LDLCALC 70  CHOLHDL 3.2    Hematology Recent Labs  Lab 11/03/20 0321 11/04/20 0501 11/04/20 2351  WBC 6.2 6.3 5.5  RBC 4.82 4.79 4.22  HGB 15.1 14.9 13.2  HCT 43.8 42.5 38.2*  MCV 90.9 88.7 90.5  MCH 31.3 31.1 31.3  MCHC 34.5 35.1 34.6  RDW 12.5 12.3 12.4  PLT 169 174 152   Thyroid No results for input(s): TSH, FREET4 in the last 168 hours.  BNPNo results for input(s): BNP, PROBNP in the last 168 hours.  DDimer  Recent Labs  Lab 11/02/20 2029  DDIMER 0.69*     Radiology    CARDIAC CATHETERIZATION  Result Date: 11/04/2020   Lesion #1: Rox RCA lesion is 90% stenosed.   A drug-eluting stent was successfully placed (overlapping the 48 mm stent placed distal-mid) using a SYNERGY XD 3.0X38. -Postdilated to 3.1 mm>   Post intervention, there is a 0% residual stenosis.   Lesions #2 and 3:   Mid RCA lesion is 60% stenosed. Dist RCA lesion is 70% stenosed.   A drug-eluting stent was successfully placed covering both lesions, using a SYNERGY XD 11/06/2020.  Postdilated in tapered fashion from 3.1 to 2.9 mm.   Post intervention,  there is a 0% residual stenosis throughout the stented segment.   ------   RPDA lesion is 80% stenosed -> with improved upstream flow was reduced to 60%. Successful DES PCI of proximal to distal RCA covering all lesions: 2 overlapping DES Synergy XD DES 2.75 mm x 48 mm (mid-distal), Synergy XD DES 3.0 mm x 38 mm (proximal-mid) Stented segment postdilated in tapered fashion from 3.1 to 2.9 mm. Recommendations He will be transferred to 6 E progressive unit for post PCI care Weight-based post cath hydration x10 hours-recheck creatinine in the morning. Anticipate staged PCI of the diagonal branch next week.  Can be scheduled on discharge. [id number], MD  CARDIAC CATHETERIZATION  Result Date: 11/03/2020   Prox Cx to Dist Cx lesion is 100% stenosed.   Dist LAD lesion is 80% stenosed.   2nd Diag lesion is 90% stenosed.   1st Diag lesion is 80% stenosed.   Prox RCA lesion is 90% stenosed.   Mid RCA lesion is 60% stenosed.   Dist RCA lesion is 70% stenosed.   RPDA lesion is 80% stenosed. 1.  Significant three-vessel coronary artery disease with chronic occlusion of the mid left circumflex with minimal collaterals to the OM branches, significant diffuse disease affecting a large first diagonal and severe diffuse disease affecting the right coronary artery from the proximal segment all the way to the PDA/PLA bifurcation with significant right PDA disease. 2.  Left ventricular angiography was not performed due to chronic kidney disease.  Mildly elevated left ventricular end-diastolic pressure. Recommendations: Difficult management options.  CABG is not a good option given that the LAD itself is not significantly disease and the OM branches are not graftable.  His best option is to proceed with right coronary artery PCI and overlapped stents placement.  We will need to treat all the way distally to the proximal segment with overlapped stents.  PCI of the diagonal can then be staged to be done outpatient to minimize risk  of contrast-induced nephropathy.  I discussed the case with Dr. Eldridge Dace.  I will hydrate him today and recheck renal function tomorrow.   ECHOCARDIOGRAM COMPLETE  Result Date: 11/03/2020    ECHOCARDIOGRAM REPORT   Patient Name:   Eduardo Weeks Date of Exam: 11/03/2020 Medical Rec #:  [id number]          Height:       69.0 in Accession #:    [id number]         Weight:       187.0 lb Date of Birth:  [birth year]         BSA:          2.008 m Patient Age:    59 years           BP:           153/101 mmHg Patient Gender: M                  HR:           66 bpm. Exam Location:  Inpatient Procedure: 2D Echo, Cardiac Doppler and Color Doppler Indications:    R07.9* Chest pain, unspecified  History:        Patient has prior history of Echocardiogram examinations, most                 recent 07/15/2020. CAD, Signs/Symptoms:Chest Pain; Risk                 Factors:Hypertension.  Sonographer:    MH Referring Phys: [id number] Cyndi Bender IMPRESSIONS  1. Left ventricular ejection fraction, by estimation, is 55 to 60%. The left ventricle has normal function. The left ventricle has no definite regional wall motion abnormalities. There is moderate left ventricular hypertrophy. Left ventricular diastolic  parameters were normal.  2. Right ventricular systolic function is normal. The right ventricular size is normal. Tricuspid regurgitation signal is inadequate for assessing PA pressure.  3. The mitral valve is grossly normal/mildly degenerative. Mild to moderate mitral valve regurgitation. No evidence of mitral stenosis.  4. The aortic valve is tricuspid. Aortic valve regurgitation is not visualized. No aortic stenosis is present.  5. The inferior vena cava is normal in size with greater than 50% respiratory variability, suggesting right atrial pressure of 3 mmHg. FINDINGS  Left Ventricle: Left ventricular ejection fraction, by estimation, is 55 to 60%. The left ventricle has normal function. The left ventricle has no regional wall  motion abnormalities. The left ventricular internal cavity size was normal in size. There is  moderate left ventricular hypertrophy. Left ventricular diastolic parameters were normal. Right Ventricle: The right ventricular size is normal. No increase in right ventricular wall thickness. Right ventricular systolic function is normal. Tricuspid regurgitation signal is inadequate for assessing PA pressure. Left Atrium: Left atrial size was normal in size. Right Atrium: Right atrial size was normal in size. Pericardium: There is no evidence of pericardial effusion. Mitral Valve: The mitral valve is grossly normal. Mild to moderate mitral valve regurgitation. No evidence of mitral valve stenosis. Tricuspid Valve: The tricuspid valve is normal in structure. Tricuspid valve regurgitation is trivial. No evidence of tricuspid stenosis. Aortic Valve: The aortic valve is tricuspid. Aortic valve regurgitation is not visualized. No aortic stenosis is present. Aortic valve mean gradient measures 3.0 mmHg. Aortic valve peak gradient measures 5.2 mmHg. Aortic valve area, by VTI measures 1.91 cm. Pulmonic Valve:  The pulmonic valve was not well visualized. Pulmonic valve regurgitation is trivial. No evidence of pulmonic stenosis. Aorta: The aortic root is normal in size and structure. Venous: The inferior vena cava is normal in size with greater than 50% respiratory variability, suggesting right atrial pressure of 3 mmHg. IAS/Shunts: No atrial level shunt detected by color flow Doppler.  LEFT VENTRICLE PLAX 2D LVIDd:         3.70 cm  Diastology LVIDs:         2.50 cm  LV e' medial:    7.07 cm/s LV PW:         1.40 cm  LV E/e' medial:  12.2 LV IVS:        1.40 cm  LV e' lateral:   4.79 cm/s LVOT diam:     1.90 cm  LV E/e' lateral: 18.0 LV SV:         48 LV SV Index:   24 LVOT Area:     2.84 cm  RIGHT VENTRICLE            IVC RV S prime:     8.38 cm/s  IVC diam: 1.70 cm TAPSE (M-mode): 1.6 cm LEFT ATRIUM             Index       RIGHT  ATRIUM          Index LA diam:        2.60 cm 1.29 cm/m  RA Area:     8.30 cm LA Vol (A2C):   44.1 ml 21.96 ml/m RA Volume:   14.90 ml 7.42 ml/m LA Vol (A4C):   39.2 ml 19.52 ml/m LA Biplane Vol: 41.4 ml 20.62 ml/m  AORTIC VALVE                   PULMONIC VALVE AV Area (Vmax):    2.06 cm    PV Vmax:       0.51 m/s AV Area (Vmean):   1.86 cm    PV Peak grad:  1.1 mmHg AV Area (VTI):     1.91 cm AV Vmax:           114.00 cm/s AV Vmean:          82.100 cm/s AV VTI:            0.249 m AV Peak Grad:      5.2 mmHg AV Mean Grad:      3.0 mmHg LVOT Vmax:         82.90 cm/s LVOT Vmean:        53.900 cm/s LVOT VTI:          0.168 m LVOT/AV VTI ratio: 0.67  AORTA Ao Root diam: 3.50 cm Ao Asc diam:  3.40 cm MITRAL VALVE MV Area (PHT): 2.65 cm    SHUNTS MR Peak grad: 123.2 mmHg   Systemic VTI:  0.17 m MR Vmax:      555.00 cm/s  Systemic Diam: 1.90 cm MV E velocity: 86.10 cm/s MV A velocity: 74.10 cm/s MV E/A ratio:  1.16 Gayatri Acharya MD Electronically signed by Gayatri Acharya MD Signature Date/Time: 11/03/2020/5:40:08 PM    Final     Cardiac Studies   Cath: 11/03/20   Prox Cx to Dist Cx lesion is 100% stenosed.   Dist LAD lesion is 80% stenosed.   2nd Diag lesion is 90% stenosed.   1st Diag lesion is 80% stenosed.   Prox RCA lesion is 90% stenosed.     Mid RCA lesion is 60% stenosed.   Dist RCA lesion is 70% stenosed.   RPDA lesion is 80% stenosed.   1.  Significant three-vessel coronary artery disease with chronic occlusion of the mid left circumflex with minimal collaterals to the OM branches, significant diffuse disease affecting a large first diagonal and severe diffuse disease affecting the right coronary artery from the proximal segment all the way to the PDA/PLA bifurcation with significant right PDA disease. 2.  Left ventricular angiography was not performed due to chronic kidney disease.  Mildly elevated left ventricular end-diastolic pressure.   Recommendations: Difficult management  options.  CABG is not a good option given that the LAD itself is not significantly disease and the OM branches are not graftable.  His best option is to proceed with right coronary artery PCI and overlapped stents placement.  We will need to treat all the way distally to the proximal segment with overlapped stents.  PCI of the diagonal can then be staged to be done outpatient to minimize risk of contrast-induced nephropathy.  I discussed the case with Dr. Eldridge Dace.  I will hydrate him today and recheck renal function tomorrow.  Diagnostic Dominance: Right  Cath: 11/04/20  Lesion #1: Rox RCA lesion is 90% stenosed.   A drug-eluting stent was successfully placed (overlapping the 48 mm stent placed distal-mid) using a SYNERGY XD 3.0X38. -Postdilated to 3.1 mm>   Post intervention, there is a 0% residual stenosis.   Lesions #2 and 3:   Mid RCA lesion is 60% stenosed. Dist RCA lesion is 70% stenosed.   A drug-eluting stent was successfully placed covering both lesions, using a SYNERGY XD [id number].  Postdilated in tapered fashion from 3.1 to 2.9 mm.   Post intervention, there is a 0% residual stenosis throughout the stented segment.   ------   RPDA lesion is 80% stenosed -> with improved upstream flow was reduced to 60%.   Successful DES PCI of proximal to distal RCA covering all lesions: 2 overlapping DES  Synergy XD DES 2.75 mm x 48 mm (mid-distal),  Synergy XD DES 3.0 mm x 38 mm (proximal-mid)  Stented segment postdilated in tapered fashion from 3.1 to 2.9 mm.     Recommendations He will be transferred to 6 E progressive unit for post PCI care Weight-based post cath hydration x10 hours-recheck creatinine in the morning. Anticipate staged PCI of the diagonal branch next week.  Can be scheduled on discharge.   Bryan Lemma, MD   Diagnostic Dominance: Right Intervention    Echo: 11/03/20  IMPRESSIONS     1. Left ventricular ejection fraction, by estimation, is 55 to 60%. The  left  ventricle has normal function. The left ventricle has no definite  regional wall motion abnormalities. There is moderate left ventricular  hypertrophy. Left ventricular diastolic   parameters were normal.   2. Right ventricular systolic function is normal. The right ventricular  size is normal. Tricuspid regurgitation signal is inadequate for assessing  PA pressure.   3. The mitral valve is grossly normal/mildly degenerative. Mild to  moderate mitral valve regurgitation. No evidence of mitral stenosis.   4. The aortic valve is tricuspid. Aortic valve regurgitation is not  visualized. No aortic stenosis is present.   5. The inferior vena cava is normal in size with greater than 50%  respiratory variability, suggesting right atrial pressure of 3 mmHg.   FINDINGS   Left Ventricle: Left ventricular ejection fraction, by estimation, is 55  to 60%. The left ventricle  has normal function. The left ventricle has no  regional wall motion abnormalities. The left ventricular internal cavity  size was normal in size. There is   moderate left ventricular hypertrophy. Left ventricular diastolic  parameters were normal.   Right Ventricle: The right ventricular size is normal. No increase in  right ventricular wall thickness. Right ventricular systolic function is  normal. Tricuspid regurgitation signal is inadequate for assessing PA  pressure.   Left Atrium: Left atrial size was normal in size.   Right Atrium: Right atrial size was normal in size.   Pericardium: There is no evidence of pericardial effusion.   Mitral Valve: The mitral valve is grossly normal. Mild to moderate mitral  valve regurgitation. No evidence of mitral valve stenosis.   Tricuspid Valve: The tricuspid valve is normal in structure. Tricuspid  valve regurgitation is trivial. No evidence of tricuspid stenosis.   Aortic Valve: The aortic valve is tricuspid. Aortic valve regurgitation is  not visualized. No aortic stenosis  is present. Aortic valve mean gradient  measures 3.0 mmHg. Aortic valve peak gradient measures 5.2 mmHg. Aortic  valve area, by VTI measures 1.91  cm.   Pulmonic Valve: The pulmonic valve was not well visualized. Pulmonic valve  regurgitation is trivial. No evidence of pulmonic stenosis.   Aorta: The aortic root is normal in size and structure.   Venous: The inferior vena cava is normal in size with greater than 50%  respiratory variability, suggesting right atrial pressure of 3 mmHg.   IAS/Shunts: No atrial level shunt detected by color flow Doppler.   Patient Profile     60 y.o. male with a hx of HTN, HLD, insulin-dependent type 2 DM, CKD III, ischemic CVA,  hepatitis C, hx of ETOH and cocaine abuse, active tobacco use, erectile dysfunction, who was seen 11/03/2020 for the evaluation of chest pain at the request of Dr. Radonna Ricker.  Assessment & Plan    Unstable Angina: High-sensitivity troponin 17>> 19.  Underwent cardiac catheterization 9/21 with significant three-vessel CAD, chronic occlusion of mid left circumflex with collaterals to OM.  Significant disease in a large first diagonal with severe diffuse disease in the RCA from proximal segment to the PDA.  Case was discussed with interventional colleagues and he was felt not to be a good candidate CABG given the LAD itself was without significant disease.  Decision was made to bring back for staged PCI on 9/22 with successful PCI/DES x2 overlapping stents to the proximal/distal RCA.  Recommendations for DAPT with aspirin/Plavix for at least 1 year likely beyond given extensive CAD.  Plan to bring back for staged PCI of diagonal branch next week. --Seen by cardiac rehab --Continue on DAPT with aspirin, Plavix, Coreg 12.5 mg twice daily, lisinopril 10 mg daily -- Scheduled for 9/28 with Dr. Herbie Baltimore.   HTN: Blood pressures have been elevated during admission.  Patient states he does not wish to add additional therapy at this time as his blood  pressures are better controlled at home.  We will continue Coreg 12.5 mg twice daily, lisinopril 10 mg daily  HLD: LDL 70, on pravastatin prior to admission --Agreeable to transition statin to Crestor 40 mg daily --LFTs/FLP in 8 weeks  CKD III: Baseline creatinine 1.6-1.7.  Improved to 1.49 at discharge  DM: Hemoglobin A1c 8.9 --Per primary  Tobacco use: Cessation advised  Hx of ischemic CVA: 07/2020.  No residual deficits --On aspirin and statin  For questions or updates, please contact [facility] Please consult [email or web address] for  contact info under        Signed, Laverda Page, NP  11/05/2020, 9:01 AM      I have examined the patient and reviewed assessment and plan and discussed with patient.  Agree with above as stated.    Plan for diagonal PCI next week.  Plan for DAPT along with aggressive secondary prevention.  RIght radial site stable.  OK to discharge from a cardiac standpoint today.  He is agreeable for the procedure next week.   Lance Muss

## 2020-11-05 NOTE — Progress Notes (Signed)
CARDIAC REHAB PHASE I   PRE:  Rate/Rhythm: 70 SR  BP:  Sitting: 112/82      SaO2: 96 RA  MODE:  Ambulation: 870 ft   POST:  Rate/Rhythm: 77 SR  BP:  Sitting: 153/79    SaO2: 97 RA   Pt ambulated 831ft in hallway independently with steady gait. Pt denies CP, SOB, or dizziness throughout walk. Pt educated on importance of ASA and Plavix. Pt given stent card along with heart healthy and diabetic diets. Reviewed site care, restrictions, and exercise guidelines. Will refer to CRP II [place] with knowledge pt needs staged intervention.  [id number] Reynold Bowen, RN BSN 11/05/2020 8:48 AM

## 2020-11-05 NOTE — Discharge Summary (Signed)
Physician Discharge Summary  Eduardo Weeks WUJ:[id number] DOB: 1961/01/11 DOA: 11/02/2020  PCP: Primus Bravo, NP  Admit date: 11/02/2020 Discharge date: 11/05/2020  Admitted From: SNF Disposition:  SNF  Recommendations for Outpatient Follow-up:  Follow up with PCP in 1-2 weeks Please obtain BMP/CBC in one week   Home Health:No Equipment/Devices:none  Discharge Condition:Stable CODE STATUS:Full Diet recommendation: Heart Healthy   Brief/Interim Summary: 60 y.o. 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  She has had a previous stress test in 2020 showed no inducible reversible ischemia  Discharge Diagnoses:  Principal Problem:   Chest pain Active Problems:   Insulin dependent type 2 diabetes mellitus (HCC)   CKD (chronic kidney disease), stage III (HCC)   Hypertension associated with diabetes (HCC)   History of CVA (cerebrovascular accident)   Unstable angina (HCC)  Typical chest pain/CAD: Cardiology was consulted and was concerned about coronary artery disease they recommended a cardiac cath that showed triple-vessel disease PCI to the RCA. They recommended to continue DAPT and he is scheduled for outpatient follow-up with Dr. Herbie Baltimore as a anticipate PCI to the diagonal branch to be scheduled as an outpatient.  Essential hypertension: Continue Coreg and ACE inhibitor, no changes made to her medication they could be titrated as an outpatient if needed. Will recommend a diuretic.  Chronic kidney disease stage IIIb: With a baseline creatinine 1.8-2 remained at baseline.  Insulin-dependent diabetes  mellitus type 2: With an A1c of 7.7 he will continue long-acting scale plus sliding scale metformin was held on admission. He has been counseled about diet and weight loss to improve his diabetes control disease contributing to his coronary artery disease.  History of CVA: Continue Plavix and statins.  Discharge Instructions  Discharge Instructions     Amb Referral to Cardiac Rehabilitation   Complete by: As directed    Diagnosis: Coronary Stents   After initial evaluation and assessments completed: Virtual Based Care may be provided alone or in conjunction with Phase 2 Cardiac Rehab based on patient barriers.: Yes   Diet - low sodium heart healthy   Complete by: As directed    Increase activity slowly   Complete by: As directed       Allergies as of 11/05/2020       Reactions   Onion Anaphylaxis, Swelling   TONGUE AND FACIAL SWELLING   Other Other (See Comments)   Onions-throat swelling        Medication List     STOP taking these medications    ibuprofen 800 MG tablet Commonly known as: ADVIL   loratadine 10 MG tablet Commonly known as: CLARITIN   lovastatin 40 MG tablet Commonly known as: MEVACOR   pantoprazole 40 MG tablet Commonly known as: PROTONIX   Tylenol Cold/Flu/Cough Night 5-6.25-10-325 MG/15ML Liqd Generic drug: Phenyleph-Doxylamine-DM-APAP       TAKE these medications    aspirin 81 MG chewable tablet Chew 1 tablet (81 mg total) by mouth daily. Start taking on: November 06, 2020   carvedilol 12.5 MG tablet Commonly known as: COREG Take 1 tablet (12.5 mg total) by mouth 2 (two) times daily with a meal.   clopidogrel 75  MG tablet Commonly known as: PLAVIX Take 1 tablet (75 mg total) by mouth daily.   gabapentin 800 MG tablet Commonly known as: NEURONTIN Take 800 mg by mouth 4 (four) times daily.   insulin aspart 100 UNIT/ML injection Commonly known as: novoLOG Inject 0-20 Units into the skin 3 (three) times daily. Per sliding  scale  Based on [facility]  readings   Lantus SoloStar 100 UNIT/ML Solostar Pen Generic drug: insulin glargine Inject 40 Units into the skin daily.   lisinopril 10 MG tablet Commonly known as: ZESTRIL Take 10 mg by mouth daily.   metFORMIN 500 MG tablet Commonly known as: GLUCOPHAGE Take 500 mg by mouth 2 (two) times daily with a meal.   metoprolol succinate 50 MG 24 hr tablet Commonly known as: TOPROL-XL Take 1 tablet (50 mg total) by mouth daily.   rosuvastatin 40 MG tablet Commonly known as: CRESTOR Take 1 tablet (40 mg total) by mouth daily.   tadalafil 10 MG tablet Commonly known as: CIALIS Take 10 mg by mouth daily as needed for erectile dysfunction.        Follow-up Information     [facility] Follow up on 11/10/2020.   Why: please arrive at 9am for scheduled cardiac cath procedure to receive IVFs prior. You will need to have nothing to eat or drink after midnight. Contact information: [address] [place] [address] [phone]               Allergies  Allergen Reactions   Onion Anaphylaxis and Swelling    TONGUE AND FACIAL SWELLING   Other Other (See Comments)    Onions-throat swelling    Consultations: Cardiology   Procedures/Studies: DG Chest 2 View  Result Date: 11/02/2020 CLINICAL DATA:  Chest pain. EXAM: CHEST - 2 VIEW COMPARISON:  July 14, 2020 FINDINGS: The heart size and mediastinal contours are within normal limits. Both lungs are clear. The visualized skeletal structures are unremarkable. IMPRESSION: No active cardiopulmonary disease. Electronically Signed   By: Aram Candela M.D.   On: 11/02/2020 17:55   CARDIAC CATHETERIZATION  Result Date: 11/04/2020   Lesion #1: Rox RCA lesion is 90% stenosed.   A drug-eluting stent was successfully placed (overlapping the 48 mm stent placed distal-mid) using a SYNERGY XD [id number]. -Postdilated to 3.1 mm>   Post intervention, there is a 0% residual stenosis.    Lesions #2 and 3:   Mid RCA lesion is 60% stenosed. Dist RCA lesion is 70% stenosed.   A drug-eluting stent was successfully placed covering both lesions, using a SYNERGY XD [id number].  Postdilated in tapered fashion from 3.1 to 2.9 mm.   Post intervention, there is a 0% residual stenosis throughout the stented segment.   ------   RPDA lesion is 80% stenosed -> with improved upstream flow was reduced to 60%. Successful DES PCI of proximal to distal RCA covering all lesions: 2 overlapping DES Synergy XD DES 2.75 mm x 48 mm (mid-distal), Synergy XD DES 3.0 mm x 38 mm (proximal-mid) Stented segment postdilated in tapered fashion from 3.1 to 2.9 mm. Recommendations He will be transferred to 6 E progressive unit for post PCI care Weight-based post cath hydration x10 hours-recheck creatinine in the morning. Anticipate staged PCI of the diagonal branch next week.  Can be scheduled on discharge. Bryan Lemma, MD  CARDIAC CATHETERIZATION  Result Date: 11/03/2020   Prox Cx to Dist Cx lesion is 100% stenosed.   Dist LAD lesion is 80% stenosed.  2nd Diag lesion is 90% stenosed.   1st Diag lesion is 80% stenosed.   Prox RCA lesion is 90% stenosed.   Mid RCA lesion is 60% stenosed.   Dist RCA lesion is 70% stenosed.   RPDA lesion is 80% stenosed. 1.  Significant three-vessel coronary artery disease with chronic occlusion of the mid left circumflex with minimal collaterals to the OM branches, significant diffuse disease affecting a large first diagonal and severe diffuse disease affecting the right coronary artery from the proximal segment all the way to the PDA/PLA bifurcation with significant right PDA disease. 2.  Left ventricular angiography was not performed due to chronic kidney disease.  Mildly elevated left ventricular end-diastolic pressure. Recommendations: Difficult management options.  CABG is not a good option given that the LAD itself is not significantly disease and the OM branches are not graftable.  His best  option is to proceed with right coronary artery PCI and overlapped stents placement.  We will need to treat all the way distally to the proximal segment with overlapped stents.  PCI of the diagonal can then be staged to be done outpatient to minimize risk of contrast-induced nephropathy.  I discussed the case with Dr. Eldridge Dace.  I will hydrate him today and recheck renal function tomorrow.   ECHOCARDIOGRAM COMPLETE  Result Date: 11/03/2020    ECHOCARDIOGRAM REPORT   Patient Name:   Eduardo Weeks Date of Exam: 11/03/2020 Medical Rec #:  [id number]          Height:       69.0 in Accession #:    [id number]         Weight:       187.0 lb Date of Birth:  1960-05-12         BSA:          2.008 m Patient Age:    59 years           BP:           153/101 mmHg Patient Gender: M                  HR:           66 bpm. Exam Location:  Inpatient Procedure: 2D Echo, Cardiac Doppler and Color Doppler Indications:    R07.9* Chest pain, unspecified  History:        Patient has prior history of Echocardiogram examinations, most                 recent 07/15/2020. CAD, Signs/Symptoms:Chest Pain; Risk                 Factors:Hypertension.  Sonographer:    MH Referring Phys: [id number] Cyndi Bender IMPRESSIONS  1. Left ventricular ejection fraction, by estimation, is 55 to 60%. The left ventricle has normal function. The left ventricle has no definite regional wall motion abnormalities. There is moderate left ventricular hypertrophy. Left ventricular diastolic  parameters were normal.  2. Right ventricular systolic function is normal. The right ventricular size is normal. Tricuspid regurgitation signal is inadequate for assessing PA pressure.  3. The mitral valve is grossly normal/mildly degenerative. Mild to moderate mitral valve regurgitation. No evidence of mitral stenosis.  4. The aortic valve is tricuspid. Aortic valve regurgitation is not visualized. No aortic stenosis is present.  5. The inferior vena cava is normal in size with  greater than 50% respiratory variability, suggesting right atrial pressure of 3 mmHg. FINDINGS  Left Ventricle: Left ventricular ejection fraction,  by estimation, is 55 to 60%. The left ventricle has normal function. The left ventricle has no regional wall motion abnormalities. The left ventricular internal cavity size was normal in size. There is  moderate left ventricular hypertrophy. Left ventricular diastolic parameters were normal. Right Ventricle: The right ventricular size is normal. No increase in right ventricular wall thickness. Right ventricular systolic function is normal. Tricuspid regurgitation signal is inadequate for assessing PA pressure. Left Atrium: Left atrial size was normal in size. Right Atrium: Right atrial size was normal in size. Pericardium: There is no evidence of pericardial effusion. Mitral Valve: The mitral valve is grossly normal. Mild to moderate mitral valve regurgitation. No evidence of mitral valve stenosis. Tricuspid Valve: The tricuspid valve is normal in structure. Tricuspid valve regurgitation is trivial. No evidence of tricuspid stenosis. Aortic Valve: The aortic valve is tricuspid. Aortic valve regurgitation is not visualized. No aortic stenosis is present. Aortic valve mean gradient measures 3.0 mmHg. Aortic valve peak gradient measures 5.2 mmHg. Aortic valve area, by VTI measures 1.91 cm. Pulmonic Valve: The pulmonic valve was not well visualized. Pulmonic valve regurgitation is trivial. No evidence of pulmonic stenosis. Aorta: The aortic root is normal in size and structure. Venous: The inferior vena cava is normal in size with greater than 50% respiratory variability, suggesting right atrial pressure of 3 mmHg. IAS/Shunts: No atrial level shunt detected by color flow Doppler.  LEFT VENTRICLE PLAX 2D LVIDd:         3.70 cm  Diastology LVIDs:         2.50 cm  LV e' medial:    7.07 cm/s LV PW:         1.40 cm  LV E/e' medial:  12.2 LV IVS:        1.40 cm  LV e' lateral:    4.79 cm/s LVOT diam:     1.90 cm  LV E/e' lateral: 18.0 LV SV:         48 LV SV Index:   24 LVOT Area:     2.84 cm  RIGHT VENTRICLE            IVC RV S prime:     8.38 cm/s  IVC diam: 1.70 cm TAPSE (M-mode): 1.6 cm LEFT ATRIUM             Index       RIGHT ATRIUM          Index LA diam:        2.60 cm 1.29 cm/m  RA Area:     8.30 cm LA Vol (A2C):   44.1 ml 21.96 ml/m RA Volume:   14.90 ml 7.42 ml/m LA Vol (A4C):   39.2 ml 19.52 ml/m LA Biplane Vol: 41.4 ml 20.62 ml/m  AORTIC VALVE                   PULMONIC VALVE AV Area (Vmax):    2.06 cm    PV Vmax:       0.51 m/s AV Area (Vmean):   1.86 cm    PV Peak grad:  1.1 mmHg AV Area (VTI):     1.91 cm AV Vmax:           114.00 cm/s AV Vmean:          82.100 cm/s AV VTI:            0.249 m AV Peak Grad:      5.2 mmHg AV Mean Grad:  3.0 mmHg LVOT Vmax:         82.90 cm/s LVOT Vmean:        53.900 cm/s LVOT VTI:          0.168 m LVOT/AV VTI ratio: 0.67  AORTA Ao Root diam: 3.50 cm Ao Asc diam:  3.40 cm MITRAL VALVE MV Area (PHT): 2.65 cm    SHUNTS MR Peak grad: 123.2 mmHg   Systemic VTI:  0.17 m MR Vmax:      555.00 cm/s  Systemic Diam: 1.90 cm MV E velocity: 86.10 cm/s MV A velocity: 74.10 cm/s MV E/A ratio:  1.16 Weston Brass MD Electronically signed by Weston Brass MD Signature Date/Time: 11/03/2020/5:40:08 PM    Final      Subjective: No complaints  Discharge Exam: Vitals:   11/05/20 0752 11/05/20 0939  BP: 112/82 134/88  Pulse: 73   Resp:    Temp:    SpO2:     Vitals:   11/05/20 0344 11/05/20 0400 11/05/20 0752 11/05/20 0939  BP: (!) 181/106 (!) 177/107 112/82 134/88  Pulse: 61  73   Resp: 18     Temp: (!) 97.3 F (36.3 C)     TempSrc: Oral     SpO2: 99%     Weight:      Height:        General: Pt is alert, awake, not in acute distress Cardiovascular: RRR, S1/S2 +, no rubs, no gallops Respiratory: CTA bilaterally, no wheezing, no rhonchi Abdominal: Soft, NT, ND, bowel sounds + Extremities: no edema, no  cyanosis    The results of significant diagnostics from this hospitalization (including imaging, microbiology, ancillary and laboratory) are listed below for reference.     Microbiology: Recent Results (from the past 240 hour(s))  SARS CORONAVIRUS 2 (TAT 6-24 HRS) Nasopharyngeal Nasopharyngeal Swab     Status: None   Collection Time: 11/02/20 11:07 PM   Specimen: Nasopharyngeal Swab  Result Value Ref Range Status   SARS Coronavirus 2 NEGATIVE NEGATIVE Final    Comment: (NOTE) SARS-CoV-2 target nucleic acids are NOT DETECTED.  The SARS-CoV-2 RNA is generally detectable in upper and lower respiratory specimens during the acute phase of infection. Negative results do not preclude SARS-CoV-2 infection, do not rule out co-infections with other pathogens, and should not be used as the sole basis for treatment or other patient management decisions. Negative results must be combined with clinical observations, patient history, and epidemiological information. The expected result is Negative.  Fact Sheet for Patients: HairSlick.no  Fact Sheet for Healthcare Providers: quierodirigir.com  This test is not yet approved or cleared by the [place] FDA and  has been authorized for detection and/or diagnosis of SARS-CoV-2 by FDA under an Emergency Use Authorization (EUA). This EUA will remain  in effect (meaning this test can be used) for the duration of the COVID-19 declaration under Se ction 564(b)(1) of the Act, 21 U.S.C. section 360bbb-3(b)(1), unless the authorization is terminated or revoked sooner.  Performed at [facility], [address] [place], [place] [address]      Labs: BNP (last 3 results) No results for input(s): BNP in the last 8760 hours. Basic Metabolic Panel: Recent Labs  Lab 11/02/20 1640 11/03/20 0321 11/04/20 0059 11/04/20 0501 11/04/20 2351  NA 138 136 137 138 134*  K 4.3 4.3 4.5 4.1 4.2   CL 110 107 111 111 109  CO2 21* 21* 20* 19* 20*  GLUCOSE 123* 260* 108* 97 229*  BUN 18  18 17 18 14   CREATININE 2.11* 1.91* 1.60* 1.60* 1.49*  CALCIUM 9.6 9.2 9.1 9.4 8.7*   Liver Function Tests: Recent Labs  Lab 11/02/20 1640  AST 22  ALT 17  ALKPHOS 57  BILITOT 0.4  PROT 6.5  ALBUMIN 3.5   No results for input(s): LIPASE, AMYLASE in the last 168 hours. No results for input(s): AMMONIA in the last 168 hours. CBC: Recent Labs  Lab 11/02/20 1640 11/03/20 0321 11/04/20 0501 11/04/20 2351  WBC 6.0 6.2 6.3 5.5  HGB 15.1 15.1 14.9 13.2  HCT 43.4 43.8 42.5 38.2*  MCV 90.4 90.9 88.7 90.5  PLT 190 169 174 152   Cardiac Enzymes: No results for input(s): CKTOTAL, CKMB, CKMBINDEX, TROPONINI in the last 168 hours. BNP: Invalid input(s): POCBNP CBG: Recent Labs  Lab 11/04/20 1245 11/04/20 1632 11/04/20 2102 11/05/20 0730 11/05/20 0944  GLUCAP 84 215* 183* 151* 189*   D-Dimer Recent Labs    11/02/20 2029  DDIMER 0.69*   Hgb A1c No results for input(s): HGBA1C in the last 72 hours. Lipid Profile Recent Labs    11/03/20 0321  CHOL 122  HDL 38*  LDLCALC 70  TRIG 71  CHOLHDL 3.2   Thyroid function studies No results for input(s): TSH, T4TOTAL, T3FREE, THYROIDAB in the last 72 hours.  Invalid input(s): FREET3 Anemia work up No results for input(s): VITAMINB12, FOLATE, FERRITIN, TIBC, IRON, RETICCTPCT in the last 72 hours. Urinalysis    Component Value Date/Time   COLORURINE YELLOW 10/17/2011 1128   APPEARANCEUR CLEAR 10/17/2011 1128   LABSPEC 1.015 10/17/2011 1128   PHURINE 6.0 10/17/2011 1128   GLUCOSEU NEGATIVE 10/17/2011 1128   HGBUR NEGATIVE 10/17/2011 1128   BILIRUBINUR NEGATIVE 10/17/2011 1128   KETONESUR NEGATIVE 10/17/2011 1128   PROTEINUR NEGATIVE 10/17/2011 1128   UROBILINOGEN 1.0 10/17/2011 1128   NITRITE NEGATIVE 10/17/2011 1128   LEUKOCYTESUR NEGATIVE 10/17/2011 1128   Sepsis Labs Invalid input(s): PROCALCITONIN,  WBC,   LACTICIDVEN Microbiology Recent Results (from the past 240 hour(s))  SARS CORONAVIRUS 2 (TAT 6-24 HRS) Nasopharyngeal Nasopharyngeal Swab     Status: None   Collection Time: 11/02/20 11:07 PM   Specimen: Nasopharyngeal Swab  Result Value Ref Range Status   SARS Coronavirus 2 NEGATIVE NEGATIVE Final    Comment: (NOTE) SARS-CoV-2 target nucleic acids are NOT DETECTED.  The SARS-CoV-2 RNA is generally detectable in upper and lower respiratory specimens during the acute phase of infection. Negative results do not preclude SARS-CoV-2 infection, do not rule out co-infections with other pathogens, and should not be used as the sole basis for treatment or other patient management decisions. Negative results must be combined with clinical observations, patient history, and epidemiological information. The expected result is Negative.  Fact Sheet for Patients: 11/04/20  Fact Sheet for Healthcare Providers: HairSlick.no  This test is not yet approved or cleared by the [place] FDA and  has been authorized for detection and/or diagnosis of SARS-CoV-2 by FDA under an Emergency Use Authorization (EUA). This EUA will remain  in effect (meaning this test can be used) for the duration of the COVID-19 declaration under Se ction 564(b)(1) of the Act, 21 U.S.C. section 360bbb-3(b)(1), unless the authorization is terminated or revoked sooner.  Performed at [facility], [address], [place], [place]      SIGNED:   [id number], MD  [facility] 11/05/2020, 9:57 AM Pager   If 7PM-7AM, please contact night-coverage [email or web address] Password [email or web address]

## 2020-11-05 NOTE — Progress Notes (Addendum)
Progress Note  Patient Name: Eduardo Weeks Date of Encounter: 11/05/2020  [facility] Cardiologist: None   Subjective   Feeling well this morning.   Inpatient Medications    Scheduled Meds:  aspirin  81 mg Oral Daily   carvedilol  12.5 mg Oral BID WC   clopidogrel  75 mg Oral Daily   gabapentin  800 mg Oral QID   heparin  5,000 Units Subcutaneous Q8H   insulin aspart  0-9 Units Subcutaneous TID WC   insulin glargine-yfgn  20 Units Subcutaneous Daily   lisinopril  10 mg Oral Daily   pantoprazole  40 mg Oral Daily   polyethylene glycol  17 g Oral BID   pravastatin  40 mg Oral q1800   sodium chloride flush  3 mL Intravenous Q12H   sodium chloride flush  3 mL Intravenous Q12H   Continuous Infusions:  sodium chloride     sodium chloride     PRN Meds: sodium chloride, sodium chloride, acetaminophen, hydrALAZINE, morphine injection, ondansetron (ZOFRAN) IV, sodium chloride flush, sodium chloride flush   Vital Signs    Vitals:   11/05/20 0036 11/05/20 0344 11/05/20 0400 11/05/20 0752  BP: (!) 174/100 (!) 181/106 (!) 177/107 112/82  Pulse: 61 61  73  Resp: 18 18    Temp: (!) 97.4 F (36.3 C) (!) 97.3 F (36.3 C)    TempSrc: Oral Oral    SpO2: 99% 99%    Weight:      Height:        Intake/Output Summary (Last 24 hours) at 11/05/2020 0901 Last data filed at 11/05/2020 0035 Gross per 24 hour  Intake 340 ml  Output 1100 ml  Net -760 ml   Last 3 Weights 11/04/2020 11/03/2020 03/11/2012  Weight (lbs) 179 lb 10.8 oz 187 lb 170 lb  Weight (kg) 81.5 kg 84.823 kg 77.111 kg      Telemetry    SR - Personally Reviewed  ECG    NSR 62 bpm, continued on TWI in inferolateral leads - Personally Reviewed  Physical Exam   GEN: No acute distress.   Neck: No JVD Cardiac: RRR, no murmurs, rubs, or gallops.  Respiratory: Clear to auscultation bilaterally. GI: Soft, nontender, non-distended  MS: No edema; No deformity. Right radial cath site stable. Neuro:  Nonfocal   Psych: Normal affect   Labs    High Sensitivity Troponin:   Recent Labs  Lab 11/02/20 1640 11/03/20 0321 11/03/20 0625  TROPONINIHS 23* 17 19*     Chemistry Recent Labs  Lab 11/02/20 1640 11/03/20 0321 11/04/20 0059 11/04/20 0501 11/04/20 2351  NA 138   < > 137 138 134*  K 4.3   < > 4.5 4.1 4.2  CL 110   < > 111 111 109  CO2 21*   < > 20* 19* 20*  GLUCOSE 123*   < > 108* 97 229*  BUN 18   < > 17 18 14   CREATININE 2.11*   < > 1.60* 1.60* 1.49*  CALCIUM 9.6   < > 9.1 9.4 8.7*  PROT 6.5  --   --   --   --   ALBUMIN 3.5  --   --   --   --   AST 22  --   --   --   --   ALT 17  --   --   --   --   ALKPHOS 57  --   --   --   --  BILITOT 0.4  --   --   --   --   GFRNONAA 35*   < > 49* 49* 54*  ANIONGAP 7   < > 6 8 5    < > = values in this interval not displayed.    Lipids  Recent Labs  Lab 11/03/20 0321  CHOL 122  TRIG 71  HDL 38*  LDLCALC 70  CHOLHDL 3.2    Hematology Recent Labs  Lab 11/03/20 0321 11/04/20 0501 11/04/20 2351  WBC 6.2 6.3 5.5  RBC 4.82 4.79 4.22  HGB 15.1 14.9 13.2  HCT 43.8 42.5 38.2*  MCV 90.9 88.7 90.5  MCH 31.3 31.1 31.3  MCHC 34.5 35.1 34.6  RDW 12.5 12.3 12.4  PLT 169 174 152   Thyroid No results for input(s): TSH, FREET4 in the last 168 hours.  BNPNo results for input(s): BNP, PROBNP in the last 168 hours.  DDimer  Recent Labs  Lab 11/02/20 2029  DDIMER 0.69*     Radiology    CARDIAC CATHETERIZATION  Result Date: 11/04/2020   Lesion #1: Rox RCA lesion is 90% stenosed.   A drug-eluting stent was successfully placed (overlapping the 48 mm stent placed distal-mid) using a SYNERGY XD 3.0X38. -Postdilated to 3.1 mm>   Post intervention, there is a 0% residual stenosis.   Lesions #2 and 3:   Mid RCA lesion is 60% stenosed. Dist RCA lesion is 70% stenosed.   A drug-eluting stent was successfully placed covering both lesions, using a SYNERGY XD 11/06/2020.  Postdilated in tapered fashion from 3.1 to 2.9 mm.   Post intervention,  there is a 0% residual stenosis throughout the stented segment.   ------   RPDA lesion is 80% stenosed -> with improved upstream flow was reduced to 60%. Successful DES PCI of proximal to distal RCA covering all lesions: 2 overlapping DES Synergy XD DES 2.75 mm x 48 mm (mid-distal), Synergy XD DES 3.0 mm x 38 mm (proximal-mid) Stented segment postdilated in tapered fashion from 3.1 to 2.9 mm. Recommendations He will be transferred to 6 E progressive unit for post PCI care Weight-based post cath hydration x10 hours-recheck creatinine in the morning. Anticipate staged PCI of the diagonal branch next week.  Can be scheduled on discharge. [id number], MD  CARDIAC CATHETERIZATION  Result Date: 11/03/2020   Prox Cx to Dist Cx lesion is 100% stenosed.   Dist LAD lesion is 80% stenosed.   2nd Diag lesion is 90% stenosed.   1st Diag lesion is 80% stenosed.   Prox RCA lesion is 90% stenosed.   Mid RCA lesion is 60% stenosed.   Dist RCA lesion is 70% stenosed.   RPDA lesion is 80% stenosed. 1.  Significant three-vessel coronary artery disease with chronic occlusion of the mid left circumflex with minimal collaterals to the OM branches, significant diffuse disease affecting a large first diagonal and severe diffuse disease affecting the right coronary artery from the proximal segment all the way to the PDA/PLA bifurcation with significant right PDA disease. 2.  Left ventricular angiography was not performed due to chronic kidney disease.  Mildly elevated left ventricular end-diastolic pressure. Recommendations: Difficult management options.  CABG is not a good option given that the LAD itself is not significantly disease and the OM branches are not graftable.  His best option is to proceed with right coronary artery PCI and overlapped stents placement.  We will need to treat all the way distally to the proximal segment with overlapped stents.  PCI of the diagonal can then be staged to be done outpatient to minimize risk  of contrast-induced nephropathy.  I discussed the case with Dr. Eldridge Dace.  I will hydrate him today and recheck renal function tomorrow.   ECHOCARDIOGRAM COMPLETE  Result Date: 11/03/2020    ECHOCARDIOGRAM REPORT   Patient Name:   Eduardo Weeks Date of Exam: 11/03/2020 Medical Rec #:  [id number]          Height:       69.0 in Accession #:    [id number]         Weight:       187.0 lb Date of Birth:  [birth year]         BSA:          2.008 m Patient Age:    59 years           BP:           153/101 mmHg Patient Gender: M                  HR:           66 bpm. Exam Location:  Inpatient Procedure: 2D Echo, Cardiac Doppler and Color Doppler Indications:    R07.9* Chest pain, unspecified  History:        Patient has prior history of Echocardiogram examinations, most                 recent 07/15/2020. CAD, Signs/Symptoms:Chest Pain; Risk                 Factors:Hypertension.  Sonographer:    MH Referring Phys: [id number] Cyndi Bender IMPRESSIONS  1. Left ventricular ejection fraction, by estimation, is 55 to 60%. The left ventricle has normal function. The left ventricle has no definite regional wall motion abnormalities. There is moderate left ventricular hypertrophy. Left ventricular diastolic  parameters were normal.  2. Right ventricular systolic function is normal. The right ventricular size is normal. Tricuspid regurgitation signal is inadequate for assessing PA pressure.  3. The mitral valve is grossly normal/mildly degenerative. Mild to moderate mitral valve regurgitation. No evidence of mitral stenosis.  4. The aortic valve is tricuspid. Aortic valve regurgitation is not visualized. No aortic stenosis is present.  5. The inferior vena cava is normal in size with greater than 50% respiratory variability, suggesting right atrial pressure of 3 mmHg. FINDINGS  Left Ventricle: Left ventricular ejection fraction, by estimation, is 55 to 60%. The left ventricle has normal function. The left ventricle has no regional wall  motion abnormalities. The left ventricular internal cavity size was normal in size. There is  moderate left ventricular hypertrophy. Left ventricular diastolic parameters were normal. Right Ventricle: The right ventricular size is normal. No increase in right ventricular wall thickness. Right ventricular systolic function is normal. Tricuspid regurgitation signal is inadequate for assessing PA pressure. Left Atrium: Left atrial size was normal in size. Right Atrium: Right atrial size was normal in size. Pericardium: There is no evidence of pericardial effusion. Mitral Valve: The mitral valve is grossly normal. Mild to moderate mitral valve regurgitation. No evidence of mitral valve stenosis. Tricuspid Valve: The tricuspid valve is normal in structure. Tricuspid valve regurgitation is trivial. No evidence of tricuspid stenosis. Aortic Valve: The aortic valve is tricuspid. Aortic valve regurgitation is not visualized. No aortic stenosis is present. Aortic valve mean gradient measures 3.0 mmHg. Aortic valve peak gradient measures 5.2 mmHg. Aortic valve area, by VTI measures 1.91 cm. Pulmonic Valve:  The pulmonic valve was not well visualized. Pulmonic valve regurgitation is trivial. No evidence of pulmonic stenosis. Aorta: The aortic root is normal in size and structure. Venous: The inferior vena cava is normal in size with greater than 50% respiratory variability, suggesting right atrial pressure of 3 mmHg. IAS/Shunts: No atrial level shunt detected by color flow Doppler.  LEFT VENTRICLE PLAX 2D LVIDd:         3.70 cm  Diastology LVIDs:         2.50 cm  LV e' medial:    7.07 cm/s LV PW:         1.40 cm  LV E/e' medial:  12.2 LV IVS:        1.40 cm  LV e' lateral:   4.79 cm/s LVOT diam:     1.90 cm  LV E/e' lateral: 18.0 LV SV:         48 LV SV Index:   24 LVOT Area:     2.84 cm  RIGHT VENTRICLE            IVC RV S prime:     8.38 cm/s  IVC diam: 1.70 cm TAPSE (M-mode): 1.6 cm LEFT ATRIUM             Index       RIGHT  ATRIUM          Index LA diam:        2.60 cm 1.29 cm/m  RA Area:     8.30 cm LA Vol (A2C):   44.1 ml 21.96 ml/m RA Volume:   14.90 ml 7.42 ml/m LA Vol (A4C):   39.2 ml 19.52 ml/m LA Biplane Vol: 41.4 ml 20.62 ml/m  AORTIC VALVE                   PULMONIC VALVE AV Area (Vmax):    2.06 cm    PV Vmax:       0.51 m/s AV Area (Vmean):   1.86 cm    PV Peak grad:  1.1 mmHg AV Area (VTI):     1.91 cm AV Vmax:           114.00 cm/s AV Vmean:          82.100 cm/s AV VTI:            0.249 m AV Peak Grad:      5.2 mmHg AV Mean Grad:      3.0 mmHg LVOT Vmax:         82.90 cm/s LVOT Vmean:        53.900 cm/s LVOT VTI:          0.168 m LVOT/AV VTI ratio: 0.67  AORTA Ao Root diam: 3.50 cm Ao Asc diam:  3.40 cm MITRAL VALVE MV Area (PHT): 2.65 cm    SHUNTS MR Peak grad: 123.2 mmHg   Systemic VTI:  0.17 m MR Vmax:      555.00 cm/s  Systemic Diam: 1.90 cm MV E velocity: 86.10 cm/s MV A velocity: 74.10 cm/s MV E/A ratio:  1.16 Weston Brass MD Electronically signed by Weston Brass MD Signature Date/Time: 11/03/2020/5:40:08 PM    Final     Cardiac Studies   Cath: 11/03/20   Prox Cx to Dist Cx lesion is 100% stenosed.   Dist LAD lesion is 80% stenosed.   2nd Diag lesion is 90% stenosed.   1st Diag lesion is 80% stenosed.   Prox RCA lesion is 90% stenosed.  Mid RCA lesion is 60% stenosed.   Dist RCA lesion is 70% stenosed.   RPDA lesion is 80% stenosed.   1.  Significant three-vessel coronary artery disease with chronic occlusion of the mid left circumflex with minimal collaterals to the OM branches, significant diffuse disease affecting a large first diagonal and severe diffuse disease affecting the right coronary artery from the proximal segment all the way to the PDA/PLA bifurcation with significant right PDA disease. 2.  Left ventricular angiography was not performed due to chronic kidney disease.  Mildly elevated left ventricular end-diastolic pressure.   Recommendations: Difficult management  options.  CABG is not a good option given that the LAD itself is not significantly disease and the OM branches are not graftable.  His best option is to proceed with right coronary artery PCI and overlapped stents placement.  We will need to treat all the way distally to the proximal segment with overlapped stents.  PCI of the diagonal can then be staged to be done outpatient to minimize risk of contrast-induced nephropathy.  I discussed the case with Dr. Eldridge Dace.  I will hydrate him today and recheck renal function tomorrow.  Diagnostic Dominance: Right  Cath: 11/04/20  Lesion #1: Rox RCA lesion is 90% stenosed.   A drug-eluting stent was successfully placed (overlapping the 48 mm stent placed distal-mid) using a SYNERGY XD 3.0X38. -Postdilated to 3.1 mm>   Post intervention, there is a 0% residual stenosis.   Lesions #2 and 3:   Mid RCA lesion is 60% stenosed. Dist RCA lesion is 70% stenosed.   A drug-eluting stent was successfully placed covering both lesions, using a SYNERGY XD [id number].  Postdilated in tapered fashion from 3.1 to 2.9 mm.   Post intervention, there is a 0% residual stenosis throughout the stented segment.   ------   RPDA lesion is 80% stenosed -> with improved upstream flow was reduced to 60%.   Successful DES PCI of proximal to distal RCA covering all lesions: 2 overlapping DES  Synergy XD DES 2.75 mm x 48 mm (mid-distal),  Synergy XD DES 3.0 mm x 38 mm (proximal-mid)  Stented segment postdilated in tapered fashion from 3.1 to 2.9 mm.     Recommendations He will be transferred to 6 E progressive unit for post PCI care Weight-based post cath hydration x10 hours-recheck creatinine in the morning. Anticipate staged PCI of the diagonal branch next week.  Can be scheduled on discharge.   Bryan Lemma, MD   Diagnostic Dominance: Right Intervention    Echo: 11/03/20  IMPRESSIONS     1. Left ventricular ejection fraction, by estimation, is 55 to 60%. The  left  ventricle has normal function. The left ventricle has no definite  regional wall motion abnormalities. There is moderate left ventricular  hypertrophy. Left ventricular diastolic   parameters were normal.   2. Right ventricular systolic function is normal. The right ventricular  size is normal. Tricuspid regurgitation signal is inadequate for assessing  PA pressure.   3. The mitral valve is grossly normal/mildly degenerative. Mild to  moderate mitral valve regurgitation. No evidence of mitral stenosis.   4. The aortic valve is tricuspid. Aortic valve regurgitation is not  visualized. No aortic stenosis is present.   5. The inferior vena cava is normal in size with greater than 50%  respiratory variability, suggesting right atrial pressure of 3 mmHg.   FINDINGS   Left Ventricle: Left ventricular ejection fraction, by estimation, is 55  to 60%. The left ventricle  has normal function. The left ventricle has no  regional wall motion abnormalities. The left ventricular internal cavity  size was normal in size. There is   moderate left ventricular hypertrophy. Left ventricular diastolic  parameters were normal.   Right Ventricle: The right ventricular size is normal. No increase in  right ventricular wall thickness. Right ventricular systolic function is  normal. Tricuspid regurgitation signal is inadequate for assessing PA  pressure.   Left Atrium: Left atrial size was normal in size.   Right Atrium: Right atrial size was normal in size.   Pericardium: There is no evidence of pericardial effusion.   Mitral Valve: The mitral valve is grossly normal. Mild to moderate mitral  valve regurgitation. No evidence of mitral valve stenosis.   Tricuspid Valve: The tricuspid valve is normal in structure. Tricuspid  valve regurgitation is trivial. No evidence of tricuspid stenosis.   Aortic Valve: The aortic valve is tricuspid. Aortic valve regurgitation is  not visualized. No aortic stenosis  is present. Aortic valve mean gradient  measures 3.0 mmHg. Aortic valve peak gradient measures 5.2 mmHg. Aortic  valve area, by VTI measures 1.91  cm.   Pulmonic Valve: The pulmonic valve was not well visualized. Pulmonic valve  regurgitation is trivial. No evidence of pulmonic stenosis.   Aorta: The aortic root is normal in size and structure.   Venous: The inferior vena cava is normal in size with greater than 50%  respiratory variability, suggesting right atrial pressure of 3 mmHg.   IAS/Shunts: No atrial level shunt detected by color flow Doppler.   Patient Profile     60 y.o. male with a hx of HTN, HLD, insulin-dependent type 2 DM, CKD III, ischemic CVA,  hepatitis C, hx of ETOH and cocaine abuse, active tobacco use, erectile dysfunction, who was seen 11/03/2020 for the evaluation of chest pain at the request of Dr. Radonna Ricker.  Assessment & Plan    Unstable Angina: High-sensitivity troponin 17>> 19.  Underwent cardiac catheterization 9/21 with significant three-vessel CAD, chronic occlusion of mid left circumflex with collaterals to OM.  Significant disease in a large first diagonal with severe diffuse disease in the RCA from proximal segment to the PDA.  Case was discussed with interventional colleagues and he was felt not to be a good candidate CABG given the LAD itself was without significant disease.  Decision was made to bring back for staged PCI on 9/22 with successful PCI/DES x2 overlapping stents to the proximal/distal RCA.  Recommendations for DAPT with aspirin/Plavix for at least 1 year likely beyond given extensive CAD.  Plan to bring back for staged PCI of diagonal branch next week. --Seen by cardiac rehab --Continue on DAPT with aspirin, Plavix, Coreg 12.5 mg twice daily, lisinopril 10 mg daily -- Scheduled for 9/28 with Dr. Herbie Baltimore.   HTN: Blood pressures have been elevated during admission.  Patient states he does not wish to add additional therapy at this time as his blood  pressures are better controlled at home.  We will continue Coreg 12.5 mg twice daily, lisinopril 10 mg daily  HLD: LDL 70, on pravastatin prior to admission --Agreeable to transition statin to Crestor 40 mg daily --LFTs/FLP in 8 weeks  CKD III: Baseline creatinine 1.6-1.7.  Improved to 1.49 at discharge  DM: Hemoglobin A1c 8.9 --Per primary  Tobacco use: Cessation advised  Hx of ischemic CVA: 07/2020.  No residual deficits --On aspirin and statin  For questions or updates, please contact [facility] Please consult [email or web address] for  contact info under        Signed, Laverda Page, NP  11/05/2020, 9:01 AM      I have examined the patient and reviewed assessment and plan and discussed with patient.  Agree with above as stated.    Plan for diagonal PCI next week.  Plan for DAPT along with aggressive secondary prevention.  RIght radial site stable.  OK to discharge from a cardiac standpoint today.  He is agreeable for the procedure next week.   Lance Muss

## 2020-11-08 ENCOUNTER — Telehealth: Payer: Self-pay | Admitting: *Deleted

## 2020-11-08 NOTE — Telephone Encounter (Signed)
I think it is relatively reasonable to hydrate him after his PCI and keep him in the hospital overnight.  I would not feel comfortable sending him home without son to stay with him.  Also, we were worried about his renal function last visit.  Bryan Lemma, MD

## 2020-11-08 NOTE — Telephone Encounter (Signed)
Coronary Intervention  scheduled at [facility] for: Wednesday November 10, 2020 1 PM Arrive [facility] Main Entrance [facility]) at: 8 AM-pre-procedure    No solid food after midnight prior to cath, clear liquids until 5 AM day of procedure.  Medication instructions: Hold: -Insulin-AM of procedure -Metformin-day of procedure and 48 hours post procedure -Lisinopril -day before and day of procedure-GFR 54   Except hold medications usual morning medications can be taken pre-cath with sips of water including: - aspirin 81 mg -Plavix 75 mg  Confirmed patient has responsible adult to drive home post procedure and be with patient first 24 hours after arriving home *   You are allowed one visitor in the waiting room during the time you are at the hospital for your procedure. Both you and your visitor must wear a mask once you enter the hospital.   Patient reports does not currently have any symptoms concerning for COVID-19 and no household members with COVID-19 like illness.              Reviewed procedure/mask/visitor instructions with patient.         * Patient reports that he does not have a car yet, he does not have family or friends that would be available to provide transportation to and from hospital.           Call placed to [facility]) to assist with transportation 11/10/20.           Patient aware transportation confirmed to [facility] for Coronary Stent 11/10/20 at 8 AM, will be at his residence around 7:30 AM to arrive 8 AM, confirmed with patient address in Epic is correct.          Patient is aware he will be required to sign waiver, can be done at hospital.               *Patient reports he would not have responsible adult to be with him the first 24 hours home if same day discharge. Patient reports his circumstances would be the same regardless of day/time of procedure.

## 2020-11-09 ENCOUNTER — Encounter: Payer: Self-pay | Admitting: Adult Health

## 2020-11-09 ENCOUNTER — Inpatient Hospital Stay: Payer: Medicaid Other | Admitting: Adult Health

## 2020-11-09 NOTE — Progress Notes (Deleted)
[facility] [address] [place]. [place] [address] [phone]       HOSPITAL FOLLOW UP NOTE  [name] Date of Birth:  1960/02/21 Medical Record Number:  [id number]   Reason for Referral:  hospital stroke follow up    SUBJECTIVE:   CHIEF COMPLAINT:  No chief complaint on file.   HPI:   Eduardo Weeks is a 60 year old male with history of hypertension hyperlipidemia, diabetes, smoker, CKD, previous substance and alcohol abuse admitted on 07/14/2020 for elevated BP (consistently >200), headache and lightheadedness.  CT no acute abnormality.  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  MRA and carotid Doppler unremarkable.  EF 55 to 60%.  Creatinine 1.63.  LDL 80, A1c 8.5, UDS negative.  Evaluated by Dr. Roda Shutters without focal deficit on exam and etiology for symptoms most likely due to hypertensive encephalopathy/urgency and etiology for stroke likely incidental finding due to small vessel disease given multiple uncontrolled risk factors.  Recommended DAPT for 3 weeks and Plavix alone as on aspirin PTA and initiated pravastatin 40 mg daily.  Tobacco use with smoking cessation counseling provided.  BP eventually stabilized with long-term BP goal normotensive range.  Other stroke risk factors include remote history of substance abuse (cocaine and EtOH quit 6 years ago). PT/OT no recommendations.          PERTINENT IMAGING      ROS:   14 system review of systems performed and negative with exception of ***  PMH:  Past Medical History:  Diagnosis Date   CKD (chronic kidney disease), stage III (HCC)    Diabetes mellitus    Hepatitis    Hep C   History of CVA (cerebrovascular accident)    Hypertension     PSH:  Past Surgical History:  Procedure Laterality Date   BACK SURGERY     bull dozer accident 55   CORONARY STENT INTERVENTION N/A 11/04/2020   Procedure: CORONARY STENT INTERVENTION;  Surgeon: Marykay Lex, MD;  Location:  [facility] INVASIVE CV LAB;  Service: Cardiovascular;  Laterality: N/A;   FRACTURE SURGERY     JOINT REPLACEMENT     LEFT HEART CATH AND CORONARY ANGIOGRAPHY N/A 11/03/2020   Procedure: LEFT HEART CATH AND CORONARY ANGIOGRAPHY;  Surgeon: Iran Ouch, MD;  Location: [facility] INVASIVE CV LAB;  Service: Cardiovascular;  Laterality: N/A;    Social History:  Social History   Socioeconomic History   Marital status: Single    Spouse name: Not on file   Number of children: Not on file   Years of education: Not on file   Highest education level: Not on file  Occupational History   Not on file  Tobacco Use   Smoking status: Some Days    Types: Cigarettes    Last attempt to quit: 08/21/2011    Years since quitting: 9.2   Smokeless tobacco: Not on file  Substance and Sexual Activity   Alcohol use: No    Comment: quit drinking 2 months ago   Drug use: No   Sexual activity: Not on file  Other Topics Concern   Not on file  Social History Narrative   Not on file   Social Determinants of Health   Financial Resource Strain: Not on file  Food Insecurity: Not on file  Transportation Needs: Not on file  Physical Activity: Not on file  Stress: Not on file  Social Connections: Not on file  Intimate Partner Violence: Not on file    Family History:  Family History  Problem Relation Age of Onset   Hypertension Mother    Diabetes Mother    Hypertension Father    Diabetes Father    Prostate cancer Father     Medications:   Current Outpatient Medications on File Prior to Visit  Medication Sig Dispense Refill   aspirin 81 MG chewable tablet Chew 1 tablet (81 mg total) by mouth daily. 90 tablet 0   carvedilol (COREG) 12.5 MG tablet Take 1 tablet (12.5 mg total) by mouth 2 (two) times daily with a meal. 90 tablet 3   clopidogrel (PLAVIX) 75 MG tablet Take 1 tablet (75 mg total) by mouth daily. 90 tablet 3   gabapentin (NEURONTIN) 800 MG tablet Take 800 mg by mouth 4 (four) times daily.     insulin  aspart (NOVOLOG) 100 UNIT/ML injection Inject 0-20 Units into the skin 3 (three) times daily. Per sliding scale  Based on [facility]  readings     LANTUS SOLOSTAR 100 UNIT/ML Solostar Pen Inject 40 Units into the skin daily.     lisinopril (ZESTRIL) 10 MG tablet Take 10 mg by mouth daily.     metFORMIN (GLUCOPHAGE) 500 MG tablet Take 500 mg by mouth 2 (two) times daily with a meal.     rosuvastatin (CRESTOR) 40 MG tablet Take 1 tablet (40 mg total) by mouth daily. 90 tablet 0   tadalafil (CIALIS) 10 MG tablet Take 10 mg by mouth daily as needed for erectile dysfunction.     No current facility-administered medications on file prior to visit.    Allergies:   Allergies  Allergen Reactions   Onion Anaphylaxis and Swelling    TONGUE AND FACIAL SWELLING   Other Other (See Comments)    Onions-throat swelling      OBJECTIVE:  Physical Exam  There were no vitals filed for this visit. There is no height or weight on file to calculate BMI. No results found.  No flowsheet data found.   General: well developed, well nourished, seated, in no evident distress Head: head normocephalic and atraumatic.   Neck: supple with no carotid or supraclavicular bruits Cardiovascular: regular rate and rhythm, no murmurs Musculoskeletal: no deformity Skin:  no rash/petichiae Vascular:  Normal pulses all extremities   Neurologic Exam Mental Status: Awake and fully alert. Oriented to place and time. Recent and remote memory intact. Attention span, concentration and fund of knowledge appropriate. Mood and affect appropriate.  Cranial Nerves: Fundoscopic exam reveals sharp disc margins. Pupils equal, briskly reactive to light. Extraocular movements full without nystagmus. Visual fields full to confrontation. Hearing intact. Facial sensation intact. Face, tongue, palate moves normally and symmetrically.  Motor: Normal bulk and tone. Normal strength in all tested extremity muscles Sensory.: intact to touch ,  pinprick , position and vibratory sensation.  Coordination: Rapid alternating movements normal in all extremities. Finger-to-nose and heel-to-shin performed accurately bilaterally. Gait and Station: Arises from chair without difficulty. Stance is normal. Gait demonstrates normal stride length and balance with ***. Tandem walk and heel toe ***.  Reflexes: 1+ and symmetric. Toes downgoing.     NIHSS  *** Modified Rankin  ***      ASSESSMENT: Eduardo Weeks is a 60 y.o. year old male with incidental finding of right frontal ACA stroke likely secondary to small vessel disease on 07/14/2020 after presenting with elevated SBP, headache and lightheadedness likely in setting of hypertensive encephalopathy/urgency. Vascular risk factors include HTN, HLD, DM, hep C, CKD stage III, tobacco use and history of  cocaine and EtOH use.      PLAN:  Right frontal ACA stroke: Continue clopidogrel 75 mg daily  and pravastatin 40 mg daily for secondary stroke prevention.  Discussed secondary stroke prevention measures and importance of close PCP follow up for aggressive stroke risk factor management. I have gone over the pathophysiology of stroke, warning signs and symptoms, risk factors and their management in some detail with instructions to go to the closest emergency room for symptoms of concern. HTN: BP goal <130/90.  Stable on *** per PCP HLD: LDL goal <70. Recent LDL 80.  Continue pravastatin 40 mg daily DMII: A1c goal<7.0. Recent A1c 8.6.  Recent med adjustments by PCP on Lantus (increased to 40 units nightly), metformin 500 mg twice daily and NovoLog on sliding scale per PCP    Follow up in *** or call earlier if needed   CC:  [facility] provider: Dr. Pearlean Brownie PCP: Primus Bravo, NP    I spent *** minutes of face-to-face and non-face-to-face time with patient.  This included previsit chart review including review of recent hospitalization, lab review, study review, order entry, electronic health  record documentation, patient education regarding recent stroke including etiology, secondary stroke prevention measures and importance of managing stroke risk factors, residual deficits and typical recovery time and answered all other questions to patient satisfaction   Ihor Austin, AGNP-BC  [facility] [address] [place], [place] [address]  Phone [phone] Fax [phone] Note: This document was prepared with digital dictation and possible smart phrase technology. Any transcriptional errors that result from this process are unintentional.

## 2020-11-10 ENCOUNTER — Other Ambulatory Visit: Payer: Self-pay

## 2020-11-10 ENCOUNTER — Encounter (HOSPITAL_COMMUNITY)
Admission: RE | Disposition: A | Discharge status: Home / Self Care | Payer: Self-pay | Source: Home / Self Care | Attending: Internal Medicine

## 2020-11-10 ENCOUNTER — Encounter (HOSPITAL_COMMUNITY): Payer: Self-pay | Admitting: Internal Medicine

## 2020-11-10 ENCOUNTER — Ambulatory Visit (HOSPITAL_COMMUNITY)
Admission: RE | Admit: 2020-11-10 | Discharge: 2020-11-11 | Disposition: A | Discharge status: Home / Self Care | Payer: Medicaid Other | Attending: Internal Medicine | Admitting: Internal Medicine

## 2020-11-10 DIAGNOSIS — Z79899 Other long term (current) drug therapy: Secondary | ICD-10-CM | POA: Diagnosis not present

## 2020-11-10 DIAGNOSIS — I25110 Atherosclerotic heart disease of native coronary artery with unstable angina pectoris: Secondary | ICD-10-CM | POA: Diagnosis not present

## 2020-11-10 DIAGNOSIS — Z955 Presence of coronary angioplasty implant and graft: Secondary | ICD-10-CM | POA: Diagnosis not present

## 2020-11-10 DIAGNOSIS — I2510 Atherosclerotic heart disease of native coronary artery without angina pectoris: Secondary | ICD-10-CM | POA: Diagnosis not present

## 2020-11-10 DIAGNOSIS — E785 Hyperlipidemia, unspecified: Secondary | ICD-10-CM | POA: Insufficient documentation

## 2020-11-10 DIAGNOSIS — Z720 Tobacco use: Secondary | ICD-10-CM | POA: Diagnosis not present

## 2020-11-10 DIAGNOSIS — Z7902 Long term (current) use of antithrombotics/antiplatelets: Secondary | ICD-10-CM | POA: Diagnosis not present

## 2020-11-10 DIAGNOSIS — Z794 Long term (current) use of insulin: Secondary | ICD-10-CM | POA: Insufficient documentation

## 2020-11-10 DIAGNOSIS — N183 Chronic kidney disease, stage 3 unspecified: Secondary | ICD-10-CM | POA: Insufficient documentation

## 2020-11-10 DIAGNOSIS — Z8673 Personal history of transient ischemic attack (TIA), and cerebral infarction without residual deficits: Secondary | ICD-10-CM | POA: Insufficient documentation

## 2020-11-10 DIAGNOSIS — E1122 Type 2 diabetes mellitus with diabetic chronic kidney disease: Secondary | ICD-10-CM | POA: Diagnosis not present

## 2020-11-10 DIAGNOSIS — I152 Hypertension secondary to endocrine disorders: Secondary | ICD-10-CM | POA: Diagnosis present

## 2020-11-10 DIAGNOSIS — I129 Hypertensive chronic kidney disease with stage 1 through stage 4 chronic kidney disease, or unspecified chronic kidney disease: Secondary | ICD-10-CM | POA: Diagnosis not present

## 2020-11-10 DIAGNOSIS — Z7982 Long term (current) use of aspirin: Secondary | ICD-10-CM | POA: Diagnosis not present

## 2020-11-10 HISTORY — PX: CORONARY ANGIOGRAPHY: CATH118303

## 2020-11-10 HISTORY — PX: CORONARY STENT INTERVENTION: CATH118234

## 2020-11-10 HISTORY — PX: CORONARY ANGIOPLASTY WITH STENT PLACEMENT: SHX49

## 2020-11-10 LAB — BASIC METABOLIC PANEL
Anion gap: 7 (ref 5–15)
BUN: 18 mg/dL (ref 6–20)
CO2: 21 mmol/L — ABNORMAL LOW (ref 22–32)
Calcium: 9.4 mg/dL (ref 8.9–10.3)
Chloride: 108 mmol/L (ref 98–111)
Creatinine, Ser: 1.81 mg/dL — ABNORMAL HIGH (ref 0.61–1.24)
GFR, Estimated: 43 mL/min — ABNORMAL LOW (ref >60)
Glucose, Bld: 145 mg/dL — ABNORMAL HIGH (ref 70–99)
Potassium: 5.2 mmol/L — ABNORMAL HIGH (ref 3.5–5.1)
Sodium: 136 mmol/L (ref 135–145)

## 2020-11-10 LAB — CBC
HCT: 42.6 % (ref 39.0–52.0)
Hemoglobin: 14.8 g/dL (ref 13.0–17.0)
MCH: 31.6 pg (ref 26.0–34.0)
MCHC: 34.7 g/dL (ref 30.0–36.0)
MCV: 90.8 fL (ref 80.0–100.0)
Platelets: 187 10*3/uL (ref 150–400)
RBC: 4.69 MIL/uL (ref 4.22–5.81)
RDW: 12.5 % (ref 11.5–15.5)
WBC: 6.7 10*3/uL (ref 4.0–10.5)
nRBC: 0 % (ref 0.0–0.2)

## 2020-11-10 LAB — HEMOGLOBIN A1C
Hgb A1c MFr Bld: 7 % — ABNORMAL HIGH (ref 4.8–5.6)
Mean Plasma Glucose: 154.2 mg/dL

## 2020-11-10 LAB — POCT ACTIVATED CLOTTING TIME
Activated Clotting Time: 237 seconds
Activated Clotting Time: 404 seconds

## 2020-11-10 LAB — GLUCOSE, CAPILLARY
Glucose-Capillary: 165 mg/dL — ABNORMAL HIGH (ref 70–99)
Glucose-Capillary: 328 mg/dL — ABNORMAL HIGH (ref 70–99)

## 2020-11-10 SURGERY — CORONARY STENT INTERVENTION
Anesthesia: LOCAL

## 2020-11-10 MED ORDER — ASPIRIN 81 MG PO CHEW
81.0000 mg | CHEWABLE_TABLET | Freq: Every day | ORAL | Status: DC
  Administered 2020-11-11: 81 mg via ORAL
  Filled 2020-11-10: qty 1

## 2020-11-10 MED ORDER — HYDRALAZINE HCL 20 MG/ML IJ SOLN: 10.0000 mg | INTRAMUSCULAR | Status: AC | PRN

## 2020-11-10 MED ORDER — ROSUVASTATIN CALCIUM 20 MG PO TABS
40.0000 mg | ORAL_TABLET | Freq: Every day | ORAL | Status: DC
  Administered 2020-11-11: 40 mg via ORAL
  Filled 2020-11-10: qty 2

## 2020-11-10 MED ORDER — SODIUM CHLORIDE 0.9 % IV SOLN: INTRAVENOUS | Status: AC

## 2020-11-10 MED ORDER — MIDAZOLAM HCL 2 MG/2ML IJ SOLN
INTRAMUSCULAR | Status: AC
  Filled 2020-11-10: qty 2

## 2020-11-10 MED ORDER — INSULIN ASPART 100 UNIT/ML IJ SOLN
0.0000 [IU] | Freq: Every day | INTRAMUSCULAR | Status: DC
  Administered 2020-11-10: 5 [IU] via SUBCUTANEOUS

## 2020-11-10 MED ORDER — IOHEXOL 350 MG/ML SOLN
INTRAVENOUS | Status: DC | PRN
  Administered 2020-11-10: 120 mL

## 2020-11-10 MED ORDER — INSULIN ASPART 100 UNIT/ML ~~LOC~~ SOLN: 0.0000 [IU] | Freq: Three times a day (TID) | SUBCUTANEOUS | Status: DC

## 2020-11-10 MED ORDER — TADALAFIL 5 MG PO TABS: 10.0000 mg | ORAL_TABLET | Freq: Every day | ORAL | Status: DC | PRN

## 2020-11-10 MED ORDER — NITROGLYCERIN 1 MG/10 ML FOR IR/CATH LAB
INTRA_ARTERIAL | Status: AC
  Filled 2020-11-10: qty 10

## 2020-11-10 MED ORDER — SODIUM CHLORIDE 0.9% FLUSH: 3.0000 mL | INTRAVENOUS | Status: DC | PRN

## 2020-11-10 MED ORDER — LISINOPRIL 10 MG PO TABS
10.0000 mg | ORAL_TABLET | Freq: Every day | ORAL | Status: DC
  Administered 2020-11-10 – 2020-11-11 (×2): 10 mg via ORAL
  Filled 2020-11-10 (×2): qty 1

## 2020-11-10 MED ORDER — FENTANYL CITRATE (PF) 100 MCG/2ML IJ SOLN
INTRAMUSCULAR | Status: AC
  Filled 2020-11-10: qty 2

## 2020-11-10 MED ORDER — INSULIN GLARGINE-YFGN 100 UNIT/ML ~~LOC~~ SOLN
40.0000 [IU] | Freq: Every day | SUBCUTANEOUS | Status: DC
  Administered 2020-11-11: 40 [IU] via SUBCUTANEOUS
  Filled 2020-11-10 (×2): qty 0.4

## 2020-11-10 MED ORDER — GABAPENTIN 400 MG PO CAPS
800.0000 mg | ORAL_CAPSULE | Freq: Four times a day (QID) | ORAL | Status: DC
  Administered 2020-11-10 – 2020-11-11 (×3): 800 mg via ORAL
  Filled 2020-11-10 (×3): qty 2

## 2020-11-10 MED ORDER — VERAPAMIL HCL 2.5 MG/ML IV SOLN
INTRAVENOUS | Status: DC | PRN
  Administered 2020-11-10: 10 mL via INTRA_ARTERIAL

## 2020-11-10 MED ORDER — IBUPROFEN 200 MG PO TABS: 800.0000 mg | ORAL_TABLET | Freq: Three times a day (TID) | ORAL | Status: DC | PRN

## 2020-11-10 MED ORDER — HEPARIN (PORCINE) IN NACL 1000-0.9 UT/500ML-% IV SOLN
INTRAVENOUS | Status: DC | PRN
  Administered 2020-11-10 (×2): 500 mL

## 2020-11-10 MED ORDER — CLOPIDOGREL BISULFATE 300 MG PO TABS
ORAL_TABLET | ORAL | Status: DC | PRN
  Administered 2020-11-10: 300 mg via ORAL

## 2020-11-10 MED ORDER — SODIUM CHLORIDE 0.9% FLUSH: 3.0000 mL | Freq: Two times a day (BID) | INTRAVENOUS | Status: DC

## 2020-11-10 MED ORDER — LIDOCAINE HCL (PF) 1 % IJ SOLN
INTRAMUSCULAR | Status: DC | PRN
  Administered 2020-11-10: 2 mL

## 2020-11-10 MED ORDER — HEPARIN (PORCINE) IN NACL 2000-0.9 UNIT/L-% IV SOLN
INTRAVENOUS | Status: AC
  Filled 2020-11-10: qty 1000

## 2020-11-10 MED ORDER — ASPIRIN 81 MG PO CHEW
CHEWABLE_TABLET | ORAL | Status: AC
  Filled 2020-11-10: qty 1

## 2020-11-10 MED ORDER — LIDOCAINE HCL (PF) 1 % IJ SOLN
INTRAMUSCULAR | Status: AC
  Filled 2020-11-10: qty 30

## 2020-11-10 MED ORDER — SODIUM CHLORIDE 0.9 % IV SOLN: 250.0000 mL | INTRAVENOUS | Status: DC | PRN

## 2020-11-10 MED ORDER — CLOPIDOGREL BISULFATE 75 MG PO TABS: 75.0000 mg | ORAL_TABLET | ORAL | Status: DC

## 2020-11-10 MED ORDER — LABETALOL HCL 5 MG/ML IV SOLN: 10.0000 mg | INTRAVENOUS | Status: AC | PRN

## 2020-11-10 MED ORDER — MIDAZOLAM HCL 2 MG/2ML IJ SOLN
INTRAMUSCULAR | Status: DC | PRN
  Administered 2020-11-10: 1 mg via INTRAVENOUS

## 2020-11-10 MED ORDER — SODIUM CHLORIDE 0.9 % WEIGHT BASED INFUSION
3.0000 mL/kg/h | INTRAVENOUS | Status: DC
  Administered 2020-11-10: 3 mL/kg/h via INTRAVENOUS

## 2020-11-10 MED ORDER — INSULIN ASPART 100 UNIT/ML IJ SOLN
0.0000 [IU] | Freq: Three times a day (TID) | INTRAMUSCULAR | Status: DC
  Administered 2020-11-11: 3 [IU] via SUBCUTANEOUS

## 2020-11-10 MED ORDER — CARVEDILOL 12.5 MG PO TABS
12.5000 mg | ORAL_TABLET | Freq: Two times a day (BID) | ORAL | Status: DC
  Administered 2020-11-10 – 2020-11-11 (×2): 12.5 mg via ORAL
  Filled 2020-11-10 (×2): qty 1

## 2020-11-10 MED ORDER — HEPARIN SODIUM (PORCINE) 1000 UNIT/ML IJ SOLN
INTRAMUSCULAR | Status: DC | PRN
  Administered 2020-11-10: 2000 [IU] via INTRAVENOUS
  Administered 2020-11-10: 6000 [IU] via INTRAVENOUS

## 2020-11-10 MED ORDER — ONDANSETRON HCL 4 MG/2ML IJ SOLN: 4.0000 mg | Freq: Four times a day (QID) | INTRAMUSCULAR | Status: DC | PRN

## 2020-11-10 MED ORDER — ASPIRIN 81 MG PO CHEW: 81.0000 mg | CHEWABLE_TABLET | ORAL | Status: DC

## 2020-11-10 MED ORDER — CLOPIDOGREL BISULFATE 75 MG PO TABS
75.0000 mg | ORAL_TABLET | Freq: Every day | ORAL | Status: DC
  Administered 2020-11-11: 75 mg via ORAL
  Filled 2020-11-10: qty 1

## 2020-11-10 MED ORDER — SODIUM CHLORIDE 0.9 % WEIGHT BASED INFUSION: 1.0000 mL/kg/h | INTRAVENOUS | Status: DC

## 2020-11-10 MED ORDER — SODIUM CHLORIDE 0.9% FLUSH
3.0000 mL | Freq: Two times a day (BID) | INTRAVENOUS | Status: DC
  Administered 2020-11-10: 3 mL via INTRAVENOUS

## 2020-11-10 MED ORDER — ACETAMINOPHEN 325 MG PO TABS: 650.0000 mg | ORAL_TABLET | ORAL | Status: DC | PRN

## 2020-11-10 MED ORDER — FENTANYL CITRATE (PF) 100 MCG/2ML IJ SOLN
INTRAMUSCULAR | Status: DC | PRN
  Administered 2020-11-10: 25 ug via INTRAVENOUS

## 2020-11-10 MED ORDER — HEPARIN SODIUM (PORCINE) 1000 UNIT/ML IJ SOLN
INTRAMUSCULAR | Status: AC
  Filled 2020-11-10: qty 1

## 2020-11-10 MED ORDER — VERAPAMIL HCL 2.5 MG/ML IV SOLN
INTRAVENOUS | Status: AC
  Filled 2020-11-10: qty 2

## 2020-11-10 SURGICAL SUPPLY — 21 items
BALLN SAPPHIRE 2.5X20 (BALLOONS) ×2
BALLN SAPPHIRE ~~LOC~~ 3.0X15 (BALLOONS) ×2 IMPLANT
BALLOON SAPPHIRE 2.5X20 (BALLOONS) ×1 IMPLANT
CATH INFINITI JR4 5F (CATHETERS) ×2 IMPLANT
CATH LAUNCHER 6FR EBU3.5 (CATHETERS) ×2 IMPLANT
DEVICE RAD COMP TR BAND LRG (VASCULAR PRODUCTS) ×2 IMPLANT
GLIDESHEATH SLEND SS 6F .021 (SHEATH) ×2 IMPLANT
GUIDEWIRE INQWIRE 1.5J.035X260 (WIRE) ×1 IMPLANT
INQWIRE 1.5J .035X260CM (WIRE) ×2
KIT ENCORE 26 ADVANTAGE (KITS) ×2 IMPLANT
KIT HEART LEFT (KITS) ×2 IMPLANT
KIT HEMO VALVE WATCHDOG (MISCELLANEOUS) ×2 IMPLANT
PACK CARDIAC CATHETERIZATION (CUSTOM PROCEDURE TRAY) ×2 IMPLANT
SHEATH PROBE COVER 6X72 (BAG) ×4 IMPLANT
STENT SYNERGY XD 2.50X48 (Permanent Stent) ×1 IMPLANT
STENT SYNERGY XD 3.0X12 (Permanent Stent) ×1 IMPLANT
SYNERGY XD 2.50X48 (Permanent Stent) ×2 IMPLANT
SYNERGY XD 3.0X12 (Permanent Stent) ×2 IMPLANT
TRANSDUCER W/STOPCOCK (MISCELLANEOUS) ×2 IMPLANT
TUBING CIL FLEX 10 FLL-RA (TUBING) ×2 IMPLANT
WIRE ASAHI PROWATER 180CM (WIRE) ×2 IMPLANT

## 2020-11-10 NOTE — Progress Notes (Signed)
TR BAND REMOVAL  LOCATION:    right radial  DEFLATED PER PROTOCOL:    Yes.    TIME BAND OFF / DRESSING APPLIED:    1900   SITE UPON ARRIVAL:    Level 0  SITE AFTER BAND REMOVAL:    Level 0  CIRCULATION SENSATION AND MOVEMENT:    Within Normal Limits   Yes.    COMMENTS:   csms and pulses present

## 2020-11-10 NOTE — Progress Notes (Signed)
RN aware

## 2020-11-11 ENCOUNTER — Other Ambulatory Visit (HOSPITAL_COMMUNITY): Payer: Self-pay

## 2020-11-11 ENCOUNTER — Encounter (HOSPITAL_COMMUNITY): Payer: Self-pay | Admitting: Internal Medicine

## 2020-11-11 DIAGNOSIS — I129 Hypertensive chronic kidney disease with stage 1 through stage 4 chronic kidney disease, or unspecified chronic kidney disease: Secondary | ICD-10-CM | POA: Diagnosis not present

## 2020-11-11 DIAGNOSIS — N183 Chronic kidney disease, stage 3 unspecified: Secondary | ICD-10-CM | POA: Diagnosis not present

## 2020-11-11 DIAGNOSIS — Z955 Presence of coronary angioplasty implant and graft: Secondary | ICD-10-CM | POA: Diagnosis not present

## 2020-11-11 DIAGNOSIS — I25119 Atherosclerotic heart disease of native coronary artery with unspecified angina pectoris: Secondary | ICD-10-CM

## 2020-11-11 DIAGNOSIS — I25110 Atherosclerotic heart disease of native coronary artery with unstable angina pectoris: Secondary | ICD-10-CM | POA: Diagnosis not present

## 2020-11-11 DIAGNOSIS — E1122 Type 2 diabetes mellitus with diabetic chronic kidney disease: Secondary | ICD-10-CM | POA: Diagnosis not present

## 2020-11-11 LAB — CBC
HCT: 39 % (ref 39.0–52.0)
Hemoglobin: 13.5 g/dL (ref 13.0–17.0)
MCH: 31.2 pg (ref 26.0–34.0)
MCHC: 34.6 g/dL (ref 30.0–36.0)
MCV: 90.1 fL (ref 80.0–100.0)
Platelets: 158 10*3/uL (ref 150–400)
RBC: 4.33 MIL/uL (ref 4.22–5.81)
RDW: 12.3 % (ref 11.5–15.5)
WBC: 5.6 10*3/uL (ref 4.0–10.5)
nRBC: 0 % (ref 0.0–0.2)

## 2020-11-11 LAB — BASIC METABOLIC PANEL
Anion gap: 6 (ref 5–15)
BUN: 16 mg/dL (ref 6–20)
CO2: 20 mmol/L — ABNORMAL LOW (ref 22–32)
Calcium: 9.1 mg/dL (ref 8.9–10.3)
Chloride: 111 mmol/L (ref 98–111)
Creatinine, Ser: 1.66 mg/dL — ABNORMAL HIGH (ref 0.61–1.24)
GFR, Estimated: 47 mL/min — ABNORMAL LOW (ref >60)
Glucose, Bld: 113 mg/dL — ABNORMAL HIGH (ref 70–99)
Potassium: 4.1 mmol/L (ref 3.5–5.1)
Sodium: 137 mmol/L (ref 135–145)

## 2020-11-11 LAB — GLUCOSE, CAPILLARY
Glucose-Capillary: 135 mg/dL — ABNORMAL HIGH (ref 70–99)
Glucose-Capillary: 137 mg/dL — ABNORMAL HIGH (ref 70–99)

## 2020-11-11 MED ORDER — COVID-19MRNA BIVAL VAC MODERNA 50 MCG/0.5ML IM SUSP
0.5000 mL | Freq: Once | INTRAMUSCULAR | Status: DC
  Filled 2020-11-11 (×3): qty 0.5

## 2020-11-11 MED ORDER — COVID-19MRNA BIVAL VACC PFIZER 30 MCG/0.3ML IM SUSP
0.3000 mL | Freq: Once | INTRAMUSCULAR | Status: DC
  Filled 2020-11-11: qty 0.3

## 2020-11-11 MED ORDER — DAPAGLIFLOZIN PROPANEDIOL 10 MG PO TABS
10.0000 mg | ORAL_TABLET | Freq: Every day | ORAL | 11 refills | Status: DC
  Filled 2020-11-11: qty 30, 30d supply, fill #0

## 2020-11-11 MED ORDER — METFORMIN HCL 500 MG PO TABS: 500.0000 mg | ORAL_TABLET | Freq: Two times a day (BID) | ORAL | Status: DC

## 2020-11-11 MED ORDER — NICOTINE 14 MG/24HR TD PT24
14.0000 mg | MEDICATED_PATCH | TRANSDERMAL | 0 refills | Status: AC
  Filled 2020-11-11: qty 28, 28d supply, fill #0

## 2020-11-11 MED ORDER — NITROGLYCERIN 0.4 MG SL SUBL
0.4000 mg | SUBLINGUAL_TABLET | SUBLINGUAL | 1 refills | Status: DC | PRN
  Filled 2020-11-11: qty 25, 8d supply, fill #0

## 2020-11-11 MED FILL — Nitroglycerin IV Soln 100 MCG/ML in D5W: INTRA_ARTERIAL | Qty: 10 | Status: AC

## 2020-11-11 NOTE — Discharge Summary (Addendum)
Discharge Summary    Patient ID: Eduardo Weeks MRN: [id number]; DOB: Oct 29, 1960  Admit date: 11/10/2020 Discharge date: 11/11/2020  PCP:  Primus Bravo, NP   [facility] Providers Cardiologist:  Lance Muss, MD     Discharge Diagnoses    Principal Problem:   CAD (coronary artery disease) Active Problems:   Hyperlipidemia   Hypertension associated with diabetes [facility])   Status post coronary artery stent placement  Diagnostic Studies/Procedures    Cath: 11/10/20   A drug-eluting stent was successfully placed using a SYNERGY XD 2.50X48.   A drug-eluting stent was successfully placed using a SYNERGY XD 3.0X12.   Post intervention, there is a 0% residual stenosis.    Successful PCI of diagonal with two overlapping drug-eluting stents. Widely patent RCA stents.   Recommendations:  Dual antiplatelet therapy for at least one year and aggressive medical management for cardiovascular disease.  Diagnostic Dominance: Right Intervention   _____________   History of Present Illness     Eduardo Weeks is a 60 y.o. male with PMH of a hx of HTN, HLD, insulin-dependent type 2 DM, CKD III, ischemic CVA,  hepatitis C, hx of ETOH and cocaine abuse, active tobacco use, erectile dysfunction, who presented to the ED 11/02/20 and found CAD. Underwent cardiac cath 9/21 with significant three-vessel CAD, chronic occlusion of mid left circumflex with collaterals to OM.  Significant disease in a large first diagonal with severe diffuse disease in the RCA from proximal segment to the PDA.  Case was discussed with interventional colleagues and he was felt not to be a good candidate CABG given the LAD itself was without significant disease.  Decision was made to bring back for staged PCI on 9/22 with successful PCI/DES x2 overlapping stents to the proximal/distal RCA. Planned to bring back for intervention to diag 9/28.   Hospital Course     Underwent cardiac cath noted above with  successful PCI of diagonal with DES x2, patent RCA stents. Planned to continue on DAPT with ASA/Brilinta for at least one year. No complications noted overnight. He was continued on home medications, with addition of farxgia along with SL nitro. Also sent in Rx for nicotine patch.   General: Well developed, well nourished, male appearing in no acute distress. Head: Normocephalic, atraumatic.  Neck: Supple without bruits, JVD. Lungs:  Resp regular and unlabored, CTA. Heart: RRR, S1, S2, no S3, S4, or murmur; no rub. Abdomen: Soft, non-tender, non-distended with normoactive bowel sounds. No hepatomegaly. No rebound/guarding. No obvious abdominal masses. Extremities: No clubbing, cyanosis, edema. Distal pedal pulses are 2+ bilaterally. L/R cath site stable without bruising or hematoma Neuro: Alert and oriented X 3. Moves all extremities spontaneously. Psych: Normal affect.  Patient was seen by Dr. Tresa Endo and deemed stable for discharge home. Follow up in the office has been arranged. Medications sent to the [facility] pharmacy prior to discharge. Educated by pharmD.   Did the patient have an acute coronary syndrome (MI, NSTEMI, STEMI, etc) this admission?:  No                               Did the patient have a percutaneous coronary intervention (stent / angioplasty)?:  Yes.     Cath/PCI Registry Performance & Quality Measures: Aspirin prescribed? - Yes ADP Receptor Inhibitor (Plavix/Clopidogrel, Brilinta/Ticagrelor or Effient/Prasugrel) prescribed (includes medically managed patients)? - Yes High Intensity Statin (Lipitor 40-80mg  or Crestor 20-40mg ) prescribed? - Yes For EF <40%, was ACEI/ARB  prescribed? - Not Applicable (EF >/= 40%) For EF <40%, Aldosterone Antagonist (Spironolactone or Eplerenone) prescribed? - Not Applicable (EF >/= 40%) Cardiac Rehab Phase II ordered? - Yes      _____________  Discharge Vitals Blood pressure (!) 139/93, pulse 77, temperature 97.6 F (36.4 C), temperature  source Oral, resp. rate (!) 21, height 5\' 9"  (1.753 m), weight 83.3 kg, SpO2 99 %.  Filed Weights   11/10/20 0835 11/10/20 1634  Weight: 81.2 kg 83.3 kg    Labs & Radiologic Studies    CBC Recent Labs    11/10/20 0916 11/11/20 0304  WBC 6.7 5.6  HGB 14.8 13.5  HCT 42.6 39.0  MCV 90.8 90.1  PLT 187 158   Basic Metabolic Panel Recent Labs    11/13/20 0916 11/11/20 0304  NA 136 137  K 5.2* 4.1  CL 108 111  CO2 21* 20*  GLUCOSE 145* 113*  BUN 18 16  CREATININE 1.81* 1.66*  CALCIUM 9.4 9.1   Liver Function Tests No results for input(s): AST, ALT, ALKPHOS, BILITOT, PROT, ALBUMIN in the last 72 hours. No results for input(s): LIPASE, AMYLASE in the last 72 hours. High Sensitivity Troponin:   Recent Labs  Lab 11/02/20 1640 11/03/20 0321 11/03/20 0625  TROPONINIHS 23* 17 19*    BNP Invalid input(s): POCBNP D-Dimer No results for input(s): DDIMER in the last 72 hours. Hemoglobin A1C Recent Labs    11/10/20 0916  HGBA1C 7.0*   Fasting Lipid Panel No results for input(s): CHOL, HDL, LDLCALC, TRIG, CHOLHDL, LDLDIRECT in the last 72 hours. Thyroid Function Tests No results for input(s): TSH, T4TOTAL, T3FREE, THYROIDAB in the last 72 hours.  Invalid input(s): FREET3 _____________  DG Chest 2 View  Result Date: 11/02/2020 CLINICAL DATA:  Chest pain. EXAM: CHEST - 2 VIEW COMPARISON:  July 14, 2020 FINDINGS: The heart size and mediastinal contours are within normal limits. Both lungs are clear. The visualized skeletal structures are unremarkable. IMPRESSION: No active cardiopulmonary disease. Electronically Signed   By: July 16, 2020 M.D.   On: 11/02/2020 17:55   CARDIAC CATHETERIZATION  Result Date: 11/11/2020   A drug-eluting stent was successfully placed using a SYNERGY XD 2.50X48.   A drug-eluting stent was successfully placed using a SYNERGY XD 3.0X12.   Post intervention, there is a 0% residual stenosis.  Successful PCI of diagonal with two overlapping  drug-eluting stents. Widely patent RCA stents. Recommendations:  Dual antiplatelet therapy for at least one year and aggressive medical management for cardiovascular disease.   CARDIAC CATHETERIZATION  Result Date: 11/04/2020   Lesion #1: Rox RCA lesion is 90% stenosed.   A drug-eluting stent was successfully placed (overlapping the 48 mm stent placed distal-mid) using a SYNERGY XD 3.0X38. -Postdilated to 3.1 mm>   Post intervention, there is a 0% residual stenosis.   Lesions #2 and 3:   Mid RCA lesion is 60% stenosed. Dist RCA lesion is 70% stenosed.   A drug-eluting stent was successfully placed covering both lesions, using a SYNERGY XD 11/06/2020.  Postdilated in tapered fashion from 3.1 to 2.9 mm.   Post intervention, there is a 0% residual stenosis throughout the stented segment.   ------   RPDA lesion is 80% stenosed -> with improved upstream flow was reduced to 60%. Successful DES PCI of proximal to distal RCA covering all lesions: 2 overlapping DES Synergy XD DES 2.75 mm x 48 mm (mid-distal), Synergy XD DES 3.0 mm x 38 mm (proximal-mid) Stented segment postdilated in tapered fashion  from 3.1 to 2.9 mm. Recommendations He will be transferred to 6 E progressive unit for post PCI care Weight-based post cath hydration x10 hours-recheck creatinine in the morning. Anticipate staged PCI of the diagonal branch next week.  Can be scheduled on discharge. Bryan Lemma, MD  CARDIAC CATHETERIZATION  Result Date: 11/03/2020   Prox Cx to Dist Cx lesion is 100% stenosed.   Dist LAD lesion is 80% stenosed.   2nd Diag lesion is 90% stenosed.   1st Diag lesion is 80% stenosed.   Prox RCA lesion is 90% stenosed.   Mid RCA lesion is 60% stenosed.   Dist RCA lesion is 70% stenosed.   RPDA lesion is 80% stenosed. 1.  Significant three-vessel coronary artery disease with chronic occlusion of the mid left circumflex with minimal collaterals to the OM branches, significant diffuse disease affecting a large first diagonal and  severe diffuse disease affecting the right coronary artery from the proximal segment all the way to the PDA/PLA bifurcation with significant right PDA disease. 2.  Left ventricular angiography was not performed due to chronic kidney disease.  Mildly elevated left ventricular end-diastolic pressure. Recommendations: Difficult management options.  CABG is not a good option given that the LAD itself is not significantly disease and the OM branches are not graftable.  His best option is to proceed with right coronary artery PCI and overlapped stents placement.  We will need to treat all the way distally to the proximal segment with overlapped stents.  PCI of the diagonal can then be staged to be done outpatient to minimize risk of contrast-induced nephropathy.  I discussed the case with Dr. Eldridge Dace.  I will hydrate him today and recheck renal function tomorrow.   ECHOCARDIOGRAM COMPLETE  Result Date: 11/03/2020    ECHOCARDIOGRAM REPORT   Patient Name:   Eduardo Weeks Date of Exam: 11/03/2020 Medical Rec #:  [id number]          Height:       69.0 in Accession #:    [id number]         Weight:       187.0 lb Date of Birth:  07-16-1960         BSA:          2.008 m Patient Age:    59 years           BP:           153/101 mmHg Patient Gender: M                  HR:           66 bpm. Exam Location:  Inpatient Procedure: 2D Echo, Cardiac Doppler and Color Doppler Indications:    R07.9* Chest pain, unspecified  History:        Patient has prior history of Echocardiogram examinations, most                 recent 07/15/2020. CAD, Signs/Symptoms:Chest Pain; Risk                 Factors:Hypertension.  Sonographer:    MH Referring Phys: [id number] Cyndi Bender IMPRESSIONS  1. Left ventricular ejection fraction, by estimation, is 55 to 60%. The left ventricle has normal function. The left ventricle has no definite regional wall motion abnormalities. There is moderate left ventricular hypertrophy. Left ventricular diastolic   parameters were normal.  2. Right ventricular systolic function is normal. The right ventricular size is normal. Tricuspid regurgitation signal  is inadequate for assessing PA pressure.  3. The mitral valve is grossly normal/mildly degenerative. Mild to moderate mitral valve regurgitation. No evidence of mitral stenosis.  4. The aortic valve is tricuspid. Aortic valve regurgitation is not visualized. No aortic stenosis is present.  5. The inferior vena cava is normal in size with greater than 50% respiratory variability, suggesting right atrial pressure of 3 mmHg. FINDINGS  Left Ventricle: Left ventricular ejection fraction, by estimation, is 55 to 60%. The left ventricle has normal function. The left ventricle has no regional wall motion abnormalities. The left ventricular internal cavity size was normal in size. There is  moderate left ventricular hypertrophy. Left ventricular diastolic parameters were normal. Right Ventricle: The right ventricular size is normal. No increase in right ventricular wall thickness. Right ventricular systolic function is normal. Tricuspid regurgitation signal is inadequate for assessing PA pressure. Left Atrium: Left atrial size was normal in size. Right Atrium: Right atrial size was normal in size. Pericardium: There is no evidence of pericardial effusion. Mitral Valve: The mitral valve is grossly normal. Mild to moderate mitral valve regurgitation. No evidence of mitral valve stenosis. Tricuspid Valve: The tricuspid valve is normal in structure. Tricuspid valve regurgitation is trivial. No evidence of tricuspid stenosis. Aortic Valve: The aortic valve is tricuspid. Aortic valve regurgitation is not visualized. No aortic stenosis is present. Aortic valve mean gradient measures 3.0 mmHg. Aortic valve peak gradient measures 5.2 mmHg. Aortic valve area, by VTI measures 1.91 cm. Pulmonic Valve: The pulmonic valve was not well visualized. Pulmonic valve regurgitation is trivial. No  evidence of pulmonic stenosis. Aorta: The aortic root is normal in size and structure. Venous: The inferior vena cava is normal in size with greater than 50% respiratory variability, suggesting right atrial pressure of 3 mmHg. IAS/Shunts: No atrial level shunt detected by color flow Doppler.  LEFT VENTRICLE PLAX 2D LVIDd:         3.70 cm  Diastology LVIDs:         2.50 cm  LV e' medial:    7.07 cm/s LV PW:         1.40 cm  LV E/e' medial:  12.2 LV IVS:        1.40 cm  LV e' lateral:   4.79 cm/s LVOT diam:     1.90 cm  LV E/e' lateral: 18.0 LV SV:         48 LV SV Index:   24 LVOT Area:     2.84 cm  RIGHT VENTRICLE            IVC RV S prime:     8.38 cm/s  IVC diam: 1.70 cm TAPSE (M-mode): 1.6 cm LEFT ATRIUM             Index       RIGHT ATRIUM          Index LA diam:        2.60 cm 1.29 cm/m  RA Area:     8.30 cm LA Vol (A2C):   44.1 ml 21.96 ml/m RA Volume:   14.90 ml 7.42 ml/m LA Vol (A4C):   39.2 ml 19.52 ml/m LA Biplane Vol: 41.4 ml 20.62 ml/m  AORTIC VALVE                   PULMONIC VALVE AV Area (Vmax):    2.06 cm    PV Vmax:       0.51 m/s AV Area (Vmean):   1.86  cm    PV Peak grad:  1.1 mmHg AV Area (VTI):     1.91 cm AV Vmax:           114.00 cm/s AV Vmean:          82.100 cm/s AV VTI:            0.249 m AV Peak Grad:      5.2 mmHg AV Mean Grad:      3.0 mmHg LVOT Vmax:         82.90 cm/s LVOT Vmean:        53.900 cm/s LVOT VTI:          0.168 m LVOT/AV VTI ratio: 0.67  AORTA Ao Root diam: 3.50 cm Ao Asc diam:  3.40 cm MITRAL VALVE MV Area (PHT): 2.65 cm    SHUNTS MR Peak grad: 123.2 mmHg   Systemic VTI:  0.17 m MR Vmax:      555.00 cm/s  Systemic Diam: 1.90 cm MV E velocity: 86.10 cm/s MV A velocity: 74.10 cm/s MV E/A ratio:  1.16 Weston Brass MD Electronically signed by Weston Brass MD Signature Date/Time: 11/03/2020/5:40:08 PM    Final    Disposition   Pt is being discharged home today in good condition.  Follow-up Plans & Appointments     Follow-up Information     Leone Brand, NP Follow up on 12/03/2020.   Specialties: Cardiology, Radiology Why: at 9:15am for your follow up appt Contact information: [address] [place] [address] [phone]                Discharge Instructions     Amb Referral to Cardiac Rehabilitation   Complete by: As directed    Diagnosis: Coronary Stents   After initial evaluation and assessments completed: Virtual Based Care may be provided alone or in conjunction with Phase 2 Cardiac Rehab based on patient barriers.: Yes   Call MD for:  difficulty breathing, headache or visual disturbances   Complete by: As directed    Call MD for:  persistant dizziness or light-headedness   Complete by: As directed    Call MD for:  redness, tenderness, or signs of infection (pain, swelling, redness, odor or green/yellow discharge around incision site)   Complete by: As directed    Diet - low sodium heart healthy   Complete by: As directed    Discharge instructions   Complete by: As directed    Radial Site Care Refer to this sheet in the next few weeks. These instructions provide you with information on caring for yourself after your procedure. Your caregiver may also give you more specific instructions. Your treatment has been planned according to current medical practices, but problems sometimes occur. Call your caregiver if you have any problems or questions after your procedure. HOME CARE INSTRUCTIONS You may shower the day after the procedure. Remove the bandage (dressing) and gently wash the site with plain soap and water. Gently pat the site dry.  Do not apply powder or lotion to the site.  Do not submerge the affected site in water for 3 to 5 days.  Inspect the site at least twice daily.  Do not flex or bend the affected arm for 24 hours.  No lifting over 5 pounds (2.3 kg) for 5 days after your procedure.  Do not drive home if you are discharged the same day of the procedure. Have someone else drive you.  You  may drive  24 hours after the procedure unless otherwise instructed by your caregiver.  What to expect: Any bruising will usually fade within 1 to 2 weeks.  Blood that collects in the tissue (hematoma) may be painful to the touch. It should usually decrease in size and tenderness within 1 to 2 weeks.  SEEK IMMEDIATE MEDICAL CARE IF: You have unusual pain at the radial site.  You have redness, warmth, swelling, or pain at the radial site.  You have drainage (other than a small amount of blood on the dressing).  You have chills.  You have a fever or persistent symptoms for more than 72 hours.  You have a fever and your symptoms suddenly get worse.  Your arm becomes pale, cool, tingly, or numb.  You have heavy bleeding from the site. Hold pressure on the site.   PLEASE DO NOT MISS ANY DOSES OF YOUR PLAVIX!!!!! Also keep a log of you blood pressures and bring back to your follow up appt. Please call the office with any questions.   Patients taking blood thinners should generally stay away from medicines like ibuprofen, Advil, Motrin, naproxen, and Aleve due to risk of stomach bleeding. You may take Tylenol as directed or talk to your primary doctor about alternatives.  PLEASE ENSURE THAT YOU DO NOT RUN OUT OF YOUR PLAVIX. This medication is very important to remain on for at least one year. IF you have issues obtaining this medication due to cost please CALL the office 3-5 business days prior to running out in order to prevent missing doses of this medication.   Increase activity slowly   Complete by: As directed        Discharge Medications   Allergies as of 11/11/2020       Reactions   Onion Anaphylaxis, Swelling   TONGUE AND FACIAL SWELLING   Other Other (See Comments)   Onions-throat swelling        Medication List     STOP taking these medications    ibuprofen 800 MG tablet Commonly known as: ADVIL       TAKE these medications    Aspirin Low Dose 81 MG chewable  tablet Generic drug: aspirin Chew 1 tablet (81 mg total) by mouth daily.   carvedilol 12.5 MG tablet Commonly known as: COREG Take 1 tablet (12.5 mg total) by mouth 2 (two) times daily with a meal.   clopidogrel 75 MG tablet Commonly known as: PLAVIX Take 1 tablet (75 mg total) by mouth daily.   dapagliflozin propanediol 10 MG Tabs tablet Commonly known as: Farxiga Take 1 tablet (10 mg total) by mouth daily before breakfast.   gabapentin 800 MG tablet Commonly known as: NEURONTIN Take 800 mg by mouth 4 (four) times daily.   insulin aspart 100 UNIT/ML injection Commonly known as: novoLOG Inject 0-20 Units into the skin 3 (three) times daily. Per sliding scale  Based on [facility]  readings   Lantus SoloStar 100 UNIT/ML Solostar Pen Generic drug: insulin glargine Inject 40 Units into the skin daily.   lisinopril 10 MG tablet Commonly known as: ZESTRIL Take 10 mg by mouth daily.   metFORMIN 500 MG tablet Commonly known as: GLUCOPHAGE Take 1 tablet (500 mg total) by mouth 2 (two) times daily with a meal. Start taking on: November 12, 2020   nicotine 14 mg/24hr patch Commonly known as: NICODERM CQ - dosed in mg/24 hours Place 1 patch (14 mg total) onto the skin daily.   nitroGLYCERIN 0.4 MG SL tablet Commonly  known as: Nitrostat Place 1 tablet (0.4 mg total) under the tongue every 5 (five) minutes as needed for chest pain.   rosuvastatin 40 MG tablet Commonly known as: CRESTOR Take 1 tablet (40 mg total) by mouth daily.   tadalafil 10 MG tablet Commonly known as: CIALIS Take 10 mg by mouth daily as needed for erectile dysfunction.        Outstanding Labs/Studies   N/a   Duration of Discharge Encounter   Greater than 30 minutes including physician time.  Signed, Laverda Page, NP 11/11/2020, 11:13 AM   Patient seen and examined. Agree with assessment and plan.  Feels well; no recurrent chest pain or shortness of breath.  He is status post DES stenting to his  RCA last week and underwent successful insertion of 2 stents to his diagonal vessel yesterday.  Right radial site is stable.  Aggressive lipid-lowering therapy with target LDL less than 70 and continue optimal blood pressure management with target LDL less than 130/80.  Okay for discharge today.   Lennette Bihari, MD, [facility] 11/11/2020 11:36 AM

## 2020-11-11 NOTE — Progress Notes (Signed)
Discharge instructions (including medications) discussed with and copy provided to patient/caregiver 

## 2020-11-11 NOTE — Progress Notes (Signed)
CARDIAC REHAB PHASE I   PRE:  Rate/Rhythm: 68 SR  BP:  Sitting: 141/95      SaO2: 99 RA  MODE:  Ambulation: 1000 ft   POST:  Rate/Rhythm: 86 SR  BP:  Sitting: 151/94    SaO2: 98 RA   Pt ambulated 157ft in hallway independently with steady gait. Pt denies CP, SOB, or dizziness throughout walk. Pt given stent card. Educated on importance of ASA and Plavix. Reinforced diet, site care, restrictions, and exercise guidelines. Will re-refer to CRP II [place]. Pt anxious for d/c.  [id number] Reynold Bowen, RN BSN 11/11/2020 9:19 AM

## 2020-11-11 NOTE — TOC Transition Note (Signed)
Transition of Care [facility]) - CM/SW Discharge Note   Patient Details  Name: Eduardo Weeks MRN: [id number] Date of Birth: 05-04-1960  Transition of Care [facility]) CM/SW Contact:  Delilah Shan, LCSWA Phone Number: 11/11/2020, 1:13 PM   Clinical Narrative:     Patient will DC to: Home  Anticipated DC date: 11/11/2020  Family notified: Patient declined  Transport by: Tressie Ellis Transportation  ?  Per MD patient ready for DC to home . RN and patient notified of DC. [facility] transport requested for patient.  CSW signing off.         Patient Goals and CMS Choice        Discharge Placement                       Discharge Plan and Services                                     Social Determinants of Health (SDOH) Interventions     Readmission Risk Interventions No flowsheet data found.

## 2020-11-11 NOTE — Plan of Care (Signed)
°  Problem: Education: °Goal: Understanding of CV disease, CV risk reduction, and recovery process will improve °Outcome: Adequate for Discharge °Goal: Individualized Educational Video(s) °Outcome: Adequate for Discharge °  °

## 2020-11-11 NOTE — TOC Benefit Eligibility Note (Signed)
Patient Product/process development scientist completed.    The patient is currently admitted and upon discharge could be taking Farxiga 10 mg.  The current 30 day co-pay is, $4.00.   The patient is currently admitted and upon discharge could be taking Jardiance 10 mg.  The current 30 day co-pay is, $4.00.   The patient is insured through [facility]     Roland Earl, CPhT Pharmacy Patient Advocate Specialist [facility] Antimicrobial Stewardship Team Direct Number: [phone]  Fax: [phone]

## 2020-11-12 NOTE — Interval H&P Note (Signed)
History and Physical Interval Note:  11/12/2020 12:05 PM  Eduardo Weeks  has presented today for surgery, with the diagnosis of CAD.  The various methods of treatment have been discussed with the patient and family. After consideration of risks, benefits and other options for treatment, the patient has consented to  Procedure(s): CORONARY STENT INTERVENTION (N/A) CORONARY ANGIOGRAPHY (N/A) as a surgical intervention.  The patient's history has been reviewed, patient examined, no change in status, stable for surgery.  I have reviewed the patient's chart and labs.  Questions were answered to the patient's satisfaction.     Bryan Lemma

## 2020-11-16 ENCOUNTER — Telehealth (HOSPITAL_COMMUNITY): Payer: Self-pay

## 2020-11-16 NOTE — Telephone Encounter (Signed)
Called patient to see if he is interested in the Cardiac Rehab Program. Patient expressed interest. Explained scheduling process and went over insurance, patient verbalized understanding. Will contact patient for scheduling once f/u has been completed.  °

## 2020-11-16 NOTE — Telephone Encounter (Signed)
Pt insurance is active through [place]. Ref# [id number]   Will fax over Medicaid Reimbursement form to Dr. Eldridge Dace   Will contact pt to see if he is interested in the Cardiac Rehab program. If interested, pt will need to complete f/u appt on. Once completed, pt will be contacted for scheduling upon review by the RN Navigator.

## 2020-11-22 ENCOUNTER — Telehealth (HOSPITAL_COMMUNITY): Payer: Self-pay

## 2020-11-22 ENCOUNTER — Other Ambulatory Visit (HOSPITAL_COMMUNITY): Payer: Self-pay

## 2020-11-22 NOTE — Telephone Encounter (Signed)
Pharmacy Transitions of Care Follow-up Telephone Call  Date of discharge: 11/05/20  Discharge Diagnosis: stent placement  How have you been since you were released from the hospital?  Patient has been well since discharge, no questions about meds at this time.  Medication changes made at discharge:     START taking: Farxiga (dapagliflozin propanediol)  nicotine (NICODERM CQ - dosed in mg/24 hours)  nitroGLYCERIN (Nitrostat)  Clopidogrel STOP taking: ibuprofen 800 MG tablet (ADVIL)  Medication changes verified by the patient? Yes    Medication Accessibility:  Home Pharmacy:  [facility] [address]  Was the patient provided with refills on discharged medications? Yes   Have all prescriptions been transferred from [facility] to home pharmacy?  Yes  Is the patient able to afford medications? Has insurance    Medication Review:  CLOPIDOGREL (PLAVIX) Clopidogrel 75 mg once daily.  - Educated patient on expected duration of therapy of ASA with clopidogrel.  - Advised patient of medications to avoid (NSAIDs, ASA)  - Educated that Tylenol (acetaminophen) will be the preferred analgesic to prevent risk of bleeding  - Emphasized importance of monitoring for signs and symptoms of bleeding (abnormal bruising, prolonged bleeding, nose bleeds, bleeding from gums, discolored urine, black tarry stools)  - Advised patient to alert all providers of anticoagulation therapy prior to starting a new medication or having a procedure   Follow-up Appointments:  PCP Hospital f/u appt confirmed? None currently scheduled   Specialist Hospital f/u appt confirmed? Scheduled to see Dr. Annie Paras on 12/03/20 @ Cardiology.   If their condition worsens, is the pt aware to call PCP or go to the Emergency Dept.? yes  Final Patient Assessment: Patient has follow up scheduled and refills at home pharmacy

## 2020-11-24 NOTE — Telephone Encounter (Signed)
Per Roxy Manns nurse navigator for cardiac rehab, pt medicaid order was denied per the doctor. Medicaid will not cover, advised pt of that. Closed referral
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
## 2020-12-03 ENCOUNTER — Encounter: Payer: Self-pay | Admitting: Cardiology

## 2020-12-03 ENCOUNTER — Other Ambulatory Visit: Payer: Self-pay

## 2020-12-03 ENCOUNTER — Ambulatory Visit: Payer: Medicaid Other | Admitting: Cardiology

## 2020-12-03 VITALS — BP 120/98 | HR 74 | Ht 69.0 in | Wt 183.0 lb

## 2020-12-03 DIAGNOSIS — E119 Type 2 diabetes mellitus without complications: Secondary | ICD-10-CM

## 2020-12-03 DIAGNOSIS — I2510 Atherosclerotic heart disease of native coronary artery without angina pectoris: Secondary | ICD-10-CM | POA: Diagnosis not present

## 2020-12-03 DIAGNOSIS — Z794 Long term (current) use of insulin: Secondary | ICD-10-CM

## 2020-12-03 DIAGNOSIS — Z79899 Other long term (current) drug therapy: Secondary | ICD-10-CM

## 2020-12-03 DIAGNOSIS — N183 Chronic kidney disease, stage 3 unspecified: Secondary | ICD-10-CM | POA: Diagnosis not present

## 2020-12-03 DIAGNOSIS — F17210 Nicotine dependence, cigarettes, uncomplicated: Secondary | ICD-10-CM

## 2020-12-03 DIAGNOSIS — E782 Mixed hyperlipidemia: Secondary | ICD-10-CM

## 2020-12-03 LAB — BASIC METABOLIC PANEL
BUN/Creatinine Ratio: 12 (ref 9–20)
BUN: 25 mg/dL — ABNORMAL HIGH (ref 6–24)
CO2: 18 mmol/L — ABNORMAL LOW (ref 20–29)
Calcium: 9.7 mg/dL (ref 8.7–10.2)
Chloride: 105 mmol/L (ref 96–106)
Creatinine, Ser: 2.1 mg/dL — ABNORMAL HIGH (ref 0.76–1.27)
Glucose: 152 mg/dL — ABNORMAL HIGH (ref 70–99)
Potassium: 4.4 mmol/L (ref 3.5–5.2)
Sodium: 138 mmol/L (ref 134–144)
eGFR: 36 mL/min/{1.73_m2} — ABNORMAL LOW (ref >59)

## 2020-12-03 MED ORDER — LISINOPRIL 5 MG PO TABS: 5.0000 mg | ORAL_TABLET | Freq: Every day | ORAL | 3 refills | Status: DC

## 2020-12-03 NOTE — Patient Instructions (Addendum)
Medication Instructions:  Your physician has recommended you make the following change in your medication:  DECREASE LISINOPRIL TO 5 MG DAILY.   *If you need a refill on your cardiac medications before your next appointment, please call your pharmacy*   Lab Work: TODAY: BMET If you have labs (blood work) drawn today and your tests are completely normal, you will receive your results only by: MyChart Message (if you have MyChart) OR A paper copy in the mail If you have any lab test that is abnormal or we need to change your treatment, we will call you to review the results.   Testing/Procedures: NONE   Follow-Up: At [facility], you and your health needs are our priority.  As part of our continuing mission to provide you with exceptional heart care, we have created designated Provider Care Teams.  These Care Teams include your primary Cardiologist (physician) and Advanced Practice Providers (APPs -  Physician Assistants and Nurse Practitioners) who all work together to provide you with the care you need, when you need it.  We recommend signing up for the patient portal called "MyChart".  Sign up information is provided on this After Visit Summary.  MyChart is used to connect with patients for Virtual Visits (Telemedicine).  Patients are able to view lab/test results, encounter notes, upcoming appointments, etc.  Non-urgent messages can be sent to your provider as well.   To learn more about what you can do with MyChart, go to [email or web address].    Your next appointment:   2 month(s)  The format for your next appointment:   In Person  Provider:   You may see Lance Muss, MD or one of the following Advanced Practice Providers on your designated Care Team:   Ronie Spies, PA-C Jacolyn Reedy, PA-C

## 2020-12-06 ENCOUNTER — Telehealth: Payer: Self-pay

## 2020-12-06 DIAGNOSIS — N183 Chronic kidney disease, stage 3 unspecified: Secondary | ICD-10-CM

## 2020-12-06 NOTE — Telephone Encounter (Signed)
-----   Message from Leone Brand, NP sent at 12/05/2020 10:15 PM EDT ----- Stop lisinopril.  Will stop due to kidney function.  Recheck BMP in 2 weeks.  Have seen in 2-4 weeks for BP check as well.  Nada Boozer, FNP-C

## 2020-12-06 NOTE — Telephone Encounter (Signed)
Spoke with pt and advised of lab results.  Pt advised per Lady Saucier to stop Lisinoptil. BMET and BP check scheduled for 12/20/2020/  Pt verbalizes understanding and agrees with current plan.

## 2020-12-12 ENCOUNTER — Telehealth: Payer: Self-pay | Admitting: Medical

## 2020-12-12 MED ORDER — DAPAGLIFLOZIN PROPANEDIOL 10 MG PO TABS: 10.0000 mg | ORAL_TABLET | Freq: Every day | ORAL | 11 refills | Status: DC

## 2020-12-12 NOTE — Telephone Encounter (Signed)
   Patient called the after hours line for refill of farxiga. Refill sent to preferred pharmacy.  Beatriz Stallion, PA-C 12/12/20; 12:03 PM

## 2020-12-20 ENCOUNTER — Other Ambulatory Visit: Payer: Medicaid Other | Admitting: *Deleted

## 2020-12-20 ENCOUNTER — Other Ambulatory Visit: Payer: Self-pay

## 2020-12-20 ENCOUNTER — Ambulatory Visit (INDEPENDENT_AMBULATORY_CARE_PROVIDER_SITE_OTHER): Payer: Medicaid Other | Admitting: Pharmacist

## 2020-12-20 VITALS — BP 130/90 | HR 76

## 2020-12-20 DIAGNOSIS — I152 Hypertension secondary to endocrine disorders: Secondary | ICD-10-CM

## 2020-12-20 DIAGNOSIS — N183 Chronic kidney disease, stage 3 unspecified: Secondary | ICD-10-CM

## 2020-12-20 DIAGNOSIS — E1159 Type 2 diabetes mellitus with other circulatory complications: Secondary | ICD-10-CM | POA: Diagnosis not present

## 2020-12-20 LAB — BASIC METABOLIC PANEL
BUN/Creatinine Ratio: 11 (ref 9–20)
BUN: 21 mg/dL (ref 6–24)
CO2: 22 mmol/L (ref 20–29)
Calcium: 9.9 mg/dL (ref 8.7–10.2)
Chloride: 104 mmol/L (ref 96–106)
Creatinine, Ser: 1.97 mg/dL — ABNORMAL HIGH (ref 0.76–1.27)
Glucose: 145 mg/dL — ABNORMAL HIGH (ref 70–99)
Potassium: 4.3 mmol/L (ref 3.5–5.2)
Sodium: 139 mmol/L (ref 134–144)
eGFR: 38 mL/min/{1.73_m2} — ABNORMAL LOW (ref >59)

## 2020-12-20 NOTE — Progress Notes (Signed)
Patient ID: Eduardo Weeks                 DOB: 1960/08/03                      MRN: [id number]     HPI: Eduardo Weeks is a 60 y.o. male patient of Dr. Irish Lack referred by Cecilie Kicks, NP to HTN clinic. PMH is significant for HTN, HLD, insulin-dependent type 2 DM, CKD III, ischemic CVA,  hepatitis C, hx of ETOH and cocaine abuse, active tobacco use, CAD s/p PCI and erectile dysfunction.  Patient was seen in follow up on 10/21 with Mickel Baas. He reported being off of lisinopril. Thought someone told him to stop. Mickel Baas resumed lisinopril 5mg  daily. Repeat scr up to 2.1. Lisinopril stopped and patient referred to PharmD clinic.  Patient presents today to PharmD clinic. He reports that he stopped taking lisinopril as directed. He has actually been taken off of lisinopril in the past as well for low BP. It was stopped in hospital as well, most likely due to scr. He has been sober from drugs and alcohol for 7 years. A1C much better controlled now that in the past. He felt dizzy/lightheaded on lisinopril 5mg  daily. He also had a few episodes of lightheaded off of lisinopril. States he can feel when his diastolic is in the 0000000. He is only smoking 2 or less cigarettes per day. Moving to a new apartment soon. Hasn't been walking as much since his stent. Isnt sure if he cant lift yet. Has an upper arm BP cuff at home. States today his BP was 130/97 but his bottom number is usually lower than that.  Current HTN meds: carvedilol 12.5mg  twice a day Previously tried: lisinopril BP goal: <130/80  Family History: The patient's family history includes Diabetes in his father and mother; Hypertension in his father and mother; Prostate cancer in his father  Social History: 2 cigarettes per day at the most, clean from drugs/alcohol for the last 7 years  Diet: not addressed today  Exercise: walks the parking lot sometimes, he is walking lightly since his stent. Plans to start going to fitness center  Home BP  readings: 130/97, states its usually lower (60's per pt)  Wt Readings from Last 3 Encounters:  12/03/20 183 lb (83 kg)  11/10/20 183 lb 10.3 oz (83.3 kg)  11/04/20 179 lb 10.8 oz (81.5 kg)   BP Readings from Last 3 Encounters:  12/03/20 (!) 120/98  11/11/20 (!) 139/93  11/05/20 134/88   Pulse Readings from Last 3 Encounters:  12/03/20 74  11/11/20 77  11/05/20 73    Renal function: CrCl cannot be calculated (Unknown ideal weight.).  Past Medical History:  Diagnosis Date   CKD (chronic kidney disease), stage III ([place])    Diabetes mellitus    Hepatitis    Hep C   History of CVA (cerebrovascular accident)    Hypertension     Current Outpatient Medications on File Prior to Visit  Medication Sig Dispense Refill   ACCU-CHEK GUIDE test strip USE TO MONITOR BLOOD SUGAR FIVE TIMES DAILY     aspirin 81 MG chewable tablet Chew 1 tablet (81 mg total) by mouth daily. 90 tablet 0   carvedilol (COREG) 12.5 MG tablet Take 1 tablet (12.5 mg total) by mouth 2 (two) times daily with a meal. 90 tablet 3   clopidogrel (PLAVIX) 75 MG tablet Take 1 tablet (75 mg total) by mouth daily. 90 tablet 3  dapagliflozin propanediol (FARXIGA) 10 MG TABS tablet Take 1 tablet (10 mg total) by mouth daily before breakfast. 30 tablet 11   gabapentin (NEURONTIN) 800 MG tablet Take 800 mg by mouth 4 (four) times daily.     insulin aspart (NOVOLOG) 100 UNIT/ML injection Inject 0-20 Units into the skin 3 (three) times daily. Per sliding scale  Based on Cbg  readings     LANTUS SOLOSTAR 100 UNIT/ML Solostar Pen Inject 40 Units into the skin daily.     metFORMIN (GLUCOPHAGE) 500 MG tablet Take 1 tablet (500 mg total) by mouth 2 (two) times daily with a meal.     nitroGLYCERIN (NITROSTAT) 0.4 MG SL tablet Place 1 tablet (0.4 mg total) under the tongue every 5 (five) minutes as needed for chest pain. 25 tablet 1   rosuvastatin (CRESTOR) 40 MG tablet Take 1 tablet (40 mg total) by mouth daily. 90 tablet 0    tadalafil (CIALIS) 10 MG tablet Take 10 mg by mouth daily as needed for erectile dysfunction.     No current facility-administered medications on file prior to visit.    Allergies  Allergen Reactions   Onion Anaphylaxis and Swelling    TONGUE AND FACIAL SWELLING   Other Other (See Comments)    Onions-throat swelling    There were no vitals taken for this visit.   Assessment/Plan:  1. Hypertension - BP is above goal of <130/80 in clinic today. Only 1 home reading available. He reports some lightheadedness not associated with standing up. He had repeat BMP drawn today. Not sure what his baseline scr is. Will get a better sense with BMP from today. Can consider adding Kerendia at next visit for CKD. I have asked patient to check his BP twice a day at home and bring his readings and BP cuff to next visit. Follow up in 2 weeks.    Thank you  Olene Floss, Pharm.D, BCPS, CPP [facility]  [address], [place], [place] [address]  Phone: [phone]; Fax: [phone]

## 2020-12-20 NOTE — Patient Instructions (Addendum)
Please check your blood pressure 2 times a day. Please write down your blood pressures and bring the paper and your blood pressure cuff with you to your next appointment.  Continue taking carvedilol 12.5mg  daily

## 2020-12-21 ENCOUNTER — Telehealth: Payer: Self-pay | Admitting: Pharmacist

## 2020-12-21 NOTE — Telephone Encounter (Signed)
Patient made aware of below message. He asked about using Epson salts for bowel movement. I recommended against this.

## 2020-12-21 NOTE — Telephone Encounter (Signed)
-----   Message from Leone Brand, NP sent at 12/21/2020 10:53 AM EST ----- Yes he is back to regular activity.  Thanks.  ----- Message ----- From: Olene Floss, RPH-CPP Sent: 12/20/2020  11:45 AM EST To: Leone Brand, NP, Corky Crafts, MD  Patient has to move by 11/15. He wants to know if he is allowed to lift anything heavy yet. Had stent placed 9/28

## 2021-01-04 ENCOUNTER — Other Ambulatory Visit: Payer: Self-pay

## 2021-01-04 ENCOUNTER — Ambulatory Visit (INDEPENDENT_AMBULATORY_CARE_PROVIDER_SITE_OTHER): Payer: Medicaid Other | Admitting: Pharmacist

## 2021-01-04 VITALS — BP 116/90 | HR 80

## 2021-01-04 DIAGNOSIS — I152 Hypertension secondary to endocrine disorders: Secondary | ICD-10-CM | POA: Diagnosis not present

## 2021-01-04 DIAGNOSIS — E1159 Type 2 diabetes mellitus with other circulatory complications: Secondary | ICD-10-CM

## 2021-01-04 DIAGNOSIS — N183 Chronic kidney disease, stage 3 unspecified: Secondary | ICD-10-CM

## 2021-01-04 MED ORDER — AMLODIPINE BESYLATE 2.5 MG PO TABS: 2.5000 mg | ORAL_TABLET | Freq: Every day | ORAL | 3 refills | Status: DC

## 2021-01-04 NOTE — Progress Notes (Signed)
Patient ID: Eduardo Weeks                 DOB: Nov 19, 1960                      MRN: [id number]     HPI: Eduardo Weeks is a 60 y.o. male patient of Dr. Eldridge Dace referred by Nada Boozer, NP to HTN clinic. PMH is significant for HTN, HLD, insulin-dependent type 2 DM, CKD III, ischemic CVA,  hepatitis C, hx of ETOH and cocaine abuse, active tobacco use, CAD s/p PCI and erectile dysfunction.  Patient was seen in follow up on 10/21 with Vernona Rieger. He reported being off of lisinopril. Thought someone told him to stop. Vernona Rieger resumed lisinopril 5mg  daily. Repeat scr up to 2.1. Lisinopril stopped and patient referred to PharmD clinic.  At last visit with PharmD clinic, his BP was 130/90. Only one home reading was available, therefore no changes were made to medications. He reported some lightheadedness. Was not walking/exercising as much since his stent was placed.   Patient presents today for follow up. States he helped his nephew move the other day and is very sore. Denies dizziness/lightheadedness. He brings in his home meter which does have a memory on it. Forgot his log. Has few readings at goal and a few above goal. States he feels dizzy if his diastolic drops below 60. Plans to start going back to the gym.  Current HTN meds: carvedilol 12.5mg  twice a day Previously tried: lisinopril BP goal: <130/80  Family History: The patient's family history includes Diabetes in his father and mother; Hypertension in his father and mother; Prostate cancer in his father  Social History: 2 cigarettes per day at the most, clean from drugs/alcohol for the last 7 years  Diet: not addressed today  Exercise: walks the parking lot sometimes, he is walking lightly since his stent. Plans to start going to fitness center  Home BP readings: 139/85, 119/77, 139/82, 158/95, 142/85, 137/82, 119/73, 121/75, 132/79, 138/86, 130/81  Wt Readings from Last 3 Encounters:  12/03/20 183 lb (83 kg)  11/10/20 183 lb 10.3  oz (83.3 kg)  11/04/20 179 lb 10.8 oz (81.5 kg)   BP Readings from Last 3 Encounters:  12/20/20 130/90  12/03/20 (!) 120/98  11/11/20 (!) 139/93   Pulse Readings from Last 3 Encounters:  12/20/20 76  12/03/20 74  11/11/20 77    Renal function: CrCl cannot be calculated (Unknown ideal weight.).  Past Medical History:  Diagnosis Date   CKD (chronic kidney disease), stage III (HCC)    Diabetes mellitus    Hepatitis    Hep C   History of CVA (cerebrovascular accident)    Hypertension     Current Outpatient Medications on File Prior to Visit  Medication Sig Dispense Refill   ACCU-CHEK GUIDE test strip USE TO MONITOR BLOOD SUGAR FIVE TIMES DAILY     aspirin 81 MG chewable tablet Chew 1 tablet (81 mg total) by mouth daily. 90 tablet 0   carvedilol (COREG) 12.5 MG tablet Take 1 tablet (12.5 mg total) by mouth 2 (two) times daily with a meal. 90 tablet 3   clopidogrel (PLAVIX) 75 MG tablet Take 1 tablet (75 mg total) by mouth daily. 90 tablet 3   dapagliflozin propanediol (FARXIGA) 10 MG TABS tablet Take 1 tablet (10 mg total) by mouth daily before breakfast. 30 tablet 11   gabapentin (NEURONTIN) 800 MG tablet Take 800 mg by mouth 4 (four) times daily.  insulin aspart (NOVOLOG) 100 UNIT/ML injection Inject 0-20 Units into the skin 3 (three) times daily. Per sliding scale  Based on Cbg  readings     LANTUS SOLOSTAR 100 UNIT/ML Solostar Pen Inject 40 Units into the skin daily.     metFORMIN (GLUCOPHAGE) 500 MG tablet Take 1 tablet (500 mg total) by mouth 2 (two) times daily with a meal.     nitroGLYCERIN (NITROSTAT) 0.4 MG SL tablet Place 1 tablet (0.4 mg total) under the tongue every 5 (five) minutes as needed for chest pain. 25 tablet 1   pantoprazole (PROTONIX) 40 MG tablet Take 40 mg by mouth daily.     rosuvastatin (CRESTOR) 40 MG tablet Take 1 tablet (40 mg total) by mouth daily. 90 tablet 0   tadalafil (CIALIS) 10 MG tablet Take 10 mg by mouth daily as needed for erectile  dysfunction.     No current facility-administered medications on file prior to visit.    Allergies  Allergen Reactions   Onion Anaphylaxis and Swelling    TONGUE AND FACIAL SWELLING   Other Other (See Comments)    Onions-throat swelling    There were no vitals taken for this visit.   Assessment/Plan:  1. Hypertension - BP is above goal of <130/80 in clinic today. Home readings vary. Will start amlodipine 2.5mg  daily. Continue carvedilol 12.5mg  BID. He has an apt with Melina Copa on Dec 12. Will have this be his BP f/u. We will be happy to see back if needed.   2. CKD- I think patient would benefit from [place]. Appears however, that it is not on the [facility] medicaid formulary. PA will be needed. Looks like it would be $9.   Thank you  Ramond Dial, Pharm.D, BCPS, CPP [place]  [id number], [place], [place] [address]  Phone: [phone]; Fax: [phone]

## 2021-01-04 NOTE — Patient Instructions (Addendum)
Please start taking amlodipine 2.5mg  daily. Continue carvedilol 12.5mg  twice a day Please continue to check your blood pressure at home. Your blood pressure goal if <130/80 Please call me at [phone] with any questions

## 2021-01-21 ENCOUNTER — Encounter: Payer: Self-pay | Admitting: Physician Assistant
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
## 2021-01-24 ENCOUNTER — Encounter: Payer: Self-pay | Admitting: Physician Assistant

## 2021-01-24 ENCOUNTER — Other Ambulatory Visit: Payer: Self-pay

## 2021-01-24 ENCOUNTER — Ambulatory Visit (INDEPENDENT_AMBULATORY_CARE_PROVIDER_SITE_OTHER): Payer: Medicaid Other | Admitting: Physician Assistant

## 2021-01-24 VITALS — BP 136/92 | HR 74 | Ht 69.0 in | Wt 184.2 lb

## 2021-01-24 DIAGNOSIS — N1832 Chronic kidney disease, stage 3b: Secondary | ICD-10-CM | POA: Diagnosis not present

## 2021-01-24 DIAGNOSIS — E785 Hyperlipidemia, unspecified: Secondary | ICD-10-CM

## 2021-01-24 DIAGNOSIS — I779 Disorder of arteries and arterioles, unspecified: Secondary | ICD-10-CM

## 2021-01-24 DIAGNOSIS — I2510 Atherosclerotic heart disease of native coronary artery without angina pectoris: Secondary | ICD-10-CM | POA: Diagnosis not present

## 2021-01-24 DIAGNOSIS — I10 Essential (primary) hypertension: Secondary | ICD-10-CM

## 2021-01-24 DIAGNOSIS — I77810 Thoracic aortic ectasia: Secondary | ICD-10-CM

## 2021-01-24 DIAGNOSIS — I340 Nonrheumatic mitral (valve) insufficiency: Secondary | ICD-10-CM

## 2021-01-24 LAB — TSH: TSH: 1.83 u[IU]/mL (ref 0.450–4.500)

## 2021-01-24 LAB — COMPREHENSIVE METABOLIC PANEL
ALT: 16 IU/L (ref 0–44)
AST: 28 IU/L (ref 0–40)
Albumin/Globulin Ratio: 1.5 (ref 1.2–2.2)
Albumin: 4.1 g/dL (ref 3.8–4.9)
Alkaline Phosphatase: 82 IU/L (ref 44–121)
BUN/Creatinine Ratio: 11 (ref 9–20)
BUN: 23 mg/dL (ref 6–24)
Bilirubin Total: 0.3 mg/dL (ref 0.0–1.2)
CO2: 23 mmol/L (ref 20–29)
Calcium: 9.4 mg/dL (ref 8.7–10.2)
Chloride: 105 mmol/L (ref 96–106)
Creatinine, Ser: 2.05 mg/dL — ABNORMAL HIGH (ref 0.76–1.27)
Globulin, Total: 2.7 g/dL (ref 1.5–4.5)
Glucose: 202 mg/dL — ABNORMAL HIGH (ref 70–99)
Potassium: 4.6 mmol/L (ref 3.5–5.2)
Sodium: 138 mmol/L (ref 134–144)
Total Protein: 6.8 g/dL (ref 6.0–8.5)
eGFR: 37 mL/min/{1.73_m2} — ABNORMAL LOW (ref >59)

## 2021-01-24 MED ORDER — ROSUVASTATIN CALCIUM 40 MG PO TABS: 40.0000 mg | ORAL_TABLET | Freq: Every day | ORAL | 3 refills | Status: DC

## 2021-01-24 NOTE — Patient Instructions (Signed)
Medication Instructions:  Your physician recommends that you continue on your current medications as directed. Please refer to the Current Medication list given to you today.  *If you need a refill on your cardiac medications before your next appointment, please call your pharmacy*   Lab Work: TODAY:  CMET & TSH 3 If you have labs (blood work) drawn today and your tests are completely normal, you will receive your results only by: MyChart Message (if you have MyChart) OR A paper copy in the mail If you have any lab test that is abnormal or we need to change your treatment, we will call you to review the results.   Testing/Procedures: None ordered  You have been referred to Nephrology.  They will contact you with an appointment.    Follow-Up: At [facility], you and your health needs are our priority.  As part of our continuing mission to provide you with exceptional heart care, we have created designated Provider Care Teams.  These Care Teams include your primary Cardiologist (physician) and Advanced Practice Providers (APPs -  Physician Assistants and Nurse Practitioners) who all work together to provide you with the care you need, when you need it.  We recommend signing up for the patient portal called "MyChart".  Sign up information is provided on this After Visit Summary.  MyChart is used to connect with patients for Virtual Visits (Telemedicine).  Patients are able to view lab/test results, encounter notes, upcoming appointments, etc.  Non-urgent messages can be sent to your provider as well.   To learn more about what you can do with MyChart, go to [email or web address].    Your next appointment:   6 month(s)  The format for your next appointment:   In Person  Provider:   Lance Muss, MD     Other Instructions Do not take nitroglycerin if you have taken tadalafil in the last 48 hours. The opposite is true as well. Do not take tadalafil if you have taken  nitroglycerin in the last 48 hours. If you take these two medicines, they can cause low blood pressure if taken close together.   Patients with kidney issues should generally stay away from medicines like ibuprofen, Advil, Motrin, naproxen, and Aleve due to risk of worsening kidney function. You may take Tylenol as directed or talk to primary doctor about alternatives for pain issues.  Please monitor your blood pressure occasionally at home. Call your doctor if you tend to get readings of greater than 130 on the top number or 80 on the bottom number.

## 2021-02-18 ENCOUNTER — Other Ambulatory Visit: Payer: Self-pay | Admitting: Nephrology

## 2021-02-18 DIAGNOSIS — N1832 Chronic kidney disease, stage 3b: Secondary | ICD-10-CM

## 2021-02-22 ENCOUNTER — Ambulatory Visit
Admission: RE | Admit: 2021-02-22 | Discharge: 2021-02-22 | Disposition: A | Discharge status: Home / Self Care | Payer: Medicaid Other | Source: Ambulatory Visit | Attending: Nephrology | Admitting: Nephrology

## 2021-02-22 DIAGNOSIS — N1832 Chronic kidney disease, stage 3b: Secondary | ICD-10-CM

## 2021-04-18 ENCOUNTER — Encounter (HOSPITAL_COMMUNITY): Payer: Self-pay | Admitting: Emergency Medicine

## 2021-04-18 ENCOUNTER — Emergency Department (HOSPITAL_COMMUNITY)
Admission: EM | Admit: 2021-04-18 | Discharge: 2021-04-18 | Disposition: A | Discharge status: Home / Self Care | Payer: Medicaid Other | Attending: Emergency Medicine | Admitting: Emergency Medicine

## 2021-04-18 ENCOUNTER — Other Ambulatory Visit: Payer: Self-pay

## 2021-04-18 DIAGNOSIS — I129 Hypertensive chronic kidney disease with stage 1 through stage 4 chronic kidney disease, or unspecified chronic kidney disease: Secondary | ICD-10-CM | POA: Diagnosis not present

## 2021-04-18 DIAGNOSIS — I2510 Atherosclerotic heart disease of native coronary artery without angina pectoris: Secondary | ICD-10-CM | POA: Insufficient documentation

## 2021-04-18 DIAGNOSIS — F17210 Nicotine dependence, cigarettes, uncomplicated: Secondary | ICD-10-CM | POA: Insufficient documentation

## 2021-04-18 DIAGNOSIS — E1122 Type 2 diabetes mellitus with diabetic chronic kidney disease: Secondary | ICD-10-CM | POA: Diagnosis not present

## 2021-04-18 DIAGNOSIS — Z20822 Contact with and (suspected) exposure to covid-19: Secondary | ICD-10-CM | POA: Insufficient documentation

## 2021-04-18 DIAGNOSIS — N183 Chronic kidney disease, stage 3 unspecified: Secondary | ICD-10-CM | POA: Diagnosis not present

## 2021-04-18 DIAGNOSIS — R112 Nausea with vomiting, unspecified: Secondary | ICD-10-CM | POA: Diagnosis present

## 2021-04-18 DIAGNOSIS — K529 Noninfective gastroenteritis and colitis, unspecified: Secondary | ICD-10-CM | POA: Insufficient documentation

## 2021-04-18 LAB — URINALYSIS, ROUTINE W REFLEX MICROSCOPIC
Bilirubin Urine: NEGATIVE
Glucose, UA: >=500 mg/dL — AB
Ketones, ur: 5 mg/dL — AB
Leukocytes,Ua: NEGATIVE
Nitrite: NEGATIVE
Protein, ur: 100 mg/dL — AB
Specific Gravity, Urine: 1.015 (ref 1.005–1.030)
pH: 5 (ref 5.0–8.0)

## 2021-04-18 LAB — COMPREHENSIVE METABOLIC PANEL
ALT: 19 U/L (ref 0–44)
AST: 28 U/L (ref 15–41)
Albumin: 3.8 g/dL (ref 3.5–5.0)
Alkaline Phosphatase: 68 U/L (ref 38–126)
Anion gap: 11 (ref 5–15)
BUN: 27 mg/dL — ABNORMAL HIGH (ref 6–20)
CO2: 22 mmol/L (ref 22–32)
Calcium: 9.3 mg/dL (ref 8.9–10.3)
Chloride: 100 mmol/L (ref 98–111)
Creatinine, Ser: 2.49 mg/dL — ABNORMAL HIGH (ref 0.61–1.24)
GFR, Estimated: 29 mL/min — ABNORMAL LOW (ref >60)
Glucose, Bld: 137 mg/dL — ABNORMAL HIGH (ref 70–99)
Potassium: 4 mmol/L (ref 3.5–5.1)
Sodium: 133 mmol/L — ABNORMAL LOW (ref 135–145)
Total Bilirubin: 0.9 mg/dL (ref 0.3–1.2)
Total Protein: 7.4 g/dL (ref 6.5–8.1)

## 2021-04-18 LAB — CBC
HCT: 51 % (ref 39.0–52.0)
Hemoglobin: 17.1 g/dL — ABNORMAL HIGH (ref 13.0–17.0)
MCH: 29.6 pg (ref 26.0–34.0)
MCHC: 33.5 g/dL (ref 30.0–36.0)
MCV: 88.2 fL (ref 80.0–100.0)
Platelets: 207 10*3/uL (ref 150–400)
RBC: 5.78 MIL/uL (ref 4.22–5.81)
RDW: 12.9 % (ref 11.5–15.5)
WBC: 8 10*3/uL (ref 4.0–10.5)
nRBC: 0 % (ref 0.0–0.2)

## 2021-04-18 LAB — RESP PANEL BY RT-PCR (FLU A&B, COVID) ARPGX2
Influenza A by PCR: NEGATIVE
Influenza B by PCR: NEGATIVE
SARS Coronavirus 2 by RT PCR: NEGATIVE

## 2021-04-18 LAB — LIPASE, BLOOD: Lipase: 28 U/L (ref 11–51)

## 2021-04-18 MED ORDER — ONDANSETRON HCL 4 MG PO TABS: 4.0000 mg | ORAL_TABLET | Freq: Three times a day (TID) | ORAL | 0 refills | Status: DC | PRN

## 2021-04-18 MED ORDER — ONDANSETRON 4 MG PO TBDP
4.0000 mg | ORAL_TABLET | Freq: Once | ORAL | Status: AC
  Administered 2021-04-18: 4 mg via ORAL
  Filled 2021-04-18: qty 1

## 2021-04-18 MED ORDER — SODIUM CHLORIDE 0.9 % IV BOLUS
1000.0000 mL | Freq: Once | INTRAVENOUS | Status: AC
  Administered 2021-04-18: 1000 mL via INTRAVENOUS

## 2021-04-18 NOTE — Discharge Instructions (Signed)
It is likely that you have a viral GI bug causing your nausea, vomiting, diarrhea ? ?In addition to this, you had a slight elevation in your kidney numbers which is why we gave you IV fluids in the ED.  Recommend following up with your primary care doctor in 3 days for post ED follow-up and recheck of your kidney labs. ? ?Additionally, we will discharge you with a prescription for Zofran which is an as needed medicine for nausea. ? ?Return to the ED if your symptoms worsen or you develop new concerning symptoms in the interim. ?

## 2021-04-18 NOTE — ED Triage Notes (Signed)
Patient complains of emesis and diarrhea for the last two days. Reports mild pain that patient states he feels is from not having any food in in stomach but otherwise denies pain. Patient afebrile in triage, is alert, oriented, ambulatory, and in no apparent distress at this time. ?

## 2021-04-18 NOTE — ED Provider Notes (Signed)
[place] EMERGENCY DEPARTMENT Provider Note  History   Chief Complaint  Patient presents with   Emesis   Eduardo Weeks is a 61 y.o. male w/ h/o CAD s/p PCI, MR, ischemic CVA, CKD 3, IDDM, HTN, HLD, h/o EtOH/cocaine use, tobacco use, who p/w N/V/D x 2d.   The history is provided by the patient.  Emesis Severity:  Mild Duration:  2 days Timing:  Intermittent Number of daily episodes:  2 Quality:  Stomach contents Able to tolerate:  Liquids Progression:  Unchanged Chronicity:  New Recent urination:  Normal Context: not post-tussive and not self-induced   Relieved by:  None tried Worsened by:  Nothing Ineffective treatments:  None tried Associated symptoms: diarrhea and sore throat   Associated symptoms: no abdominal pain, no chills, no cough, no fever and no headaches   Risk factors: diabetes   Risk factors: no alcohol use, no sick contacts, no suspect food intake and no travel to endemic areas  The history is provided by the patient.  Emesis Severity:  Mild Duration:  2 days Timing:  Intermittent Number of daily episodes:  2 Quality:  Stomach contents Able to tolerate:  Liquids Progression:  Unchanged Chronicity:  New Recent urination:  Normal Context: not post-tussive and not self-induced   Relieved by:  None tried Worsened by:  Nothing Ineffective treatments:  None tried Associated symptoms: diarrhea and sore throat   Associated symptoms: no abdominal pain, no chills, no cough, no fever and no headaches   Risk factors: diabetes   Risk factors: no alcohol use, no sick contacts, no suspect food intake and no travel to endemic areas  The history is provided by the patient.  Emesis Severity:  Mild Duration:  2 days Timing:  Intermittent Number of daily episodes:  2 Quality:  Stomach contents Able to tolerate:  Liquids Progression:  Unchanged Chronicity:  New Recent urination:  Normal Context: not post-tussive and not self-induced    Relieved by:  None tried Worsened by:  Nothing Ineffective treatments:  None tried Associated symptoms: diarrhea and sore throat   Associated symptoms: no abdominal pain, no chills, no cough, no fever and no headaches   Risk factors: diabetes   Risk factors: no alcohol use, no sick contacts, no suspect food intake and no travel to endemic areas     Past Medical History:  Diagnosis Date   Alcohol use    CAD (coronary artery disease)    CKD (chronic kidney disease), stage III (HCC)    Cocaine use    Diabetes mellitus    Dilated aortic root (HCC)    ED (erectile dysfunction)    Hepatitis    Hep C   History of CVA (cerebrovascular accident)    Hypertension    Mild carotid artery disease (HCC)    Mitral regurgitation    Mitral regurgitation    Severe left ventricular hypertrophy    Tobacco use     Social History   Tobacco Use   Smoking status: Some Days    Types: Cigarettes    Last attempt to quit: 08/21/2011    Years since quitting: 9.6   Smokeless tobacco: Never  Substance Use Topics   Alcohol use: No    Comment: quit drinking 2 months ago   Drug use: No     Family History  Problem Relation Age of Onset   Hypertension Mother    Diabetes Mother    Hypertension Father    Diabetes Father    Prostate  cancer Father     Review of Systems  Constitutional:  Negative for chills and fever.  HENT:  Positive for sore throat. Negative for congestion.   Eyes:  Negative for photophobia and visual disturbance.  Respiratory:  Negative for cough and shortness of breath.   Cardiovascular:  Negative for chest pain and palpitations.  Gastrointestinal:  Positive for diarrhea and vomiting. Negative for abdominal pain, blood in stool and nausea.  Endocrine: Negative.   Genitourinary:  Negative for dysuria and hematuria.  Musculoskeletal:  Negative for neck pain and neck stiffness.  Skin:  Negative for rash and wound.  Allergic/Immunologic: Negative.   Neurological:  Negative for  syncope and headaches.  Hematological: Negative.   Psychiatric/Behavioral: Negative.      Physical Exam   Today's Vitals   04/18/21 0941 04/18/21 0942  BP: 121/89   Pulse: 81   Resp: 16   Temp: 97.6 F (36.4 C)   TempSrc: Oral   SpO2: 98%   PainSc:  2      Physical Exam Constitutional:      General: He is not in acute distress.    Appearance: Normal appearance. He is not ill-appearing or toxic-appearing.  HENT:     Head: Normocephalic and atraumatic.     Nose: Nose normal. No congestion.     Mouth/Throat:     Mouth: Mucous membranes are moist.     Pharynx: Oropharynx is clear. Posterior oropharyngeal erythema (Posterior oropharynx) present. No oropharyngeal exudate.  Eyes:     Extraocular Movements: Extraocular movements intact.     Pupils: Pupils are equal, round, and reactive to light.  Cardiovascular:     Rate and Rhythm: Normal rate and regular rhythm.     Pulses: Normal pulses.     Heart sounds: Normal heart sounds. No murmur heard. Pulmonary:     Effort: Pulmonary effort is normal. No respiratory distress.     Breath sounds: Normal breath sounds. No stridor. No wheezing, rhonchi or rales.  Abdominal:     General: Bowel sounds are normal.     Palpations: Abdomen is soft.     Tenderness: There is no abdominal tenderness. There is no right CVA tenderness, left CVA tenderness, guarding or rebound.     Hernia: No hernia is present.  Musculoskeletal:        General: Normal range of motion.     Cervical back: Normal range of motion and neck supple. No tenderness.     Right lower leg: No edema.     Left lower leg: No edema.  Skin:    General: Skin is warm and dry.     Capillary Refill: Capillary refill takes less than 2 seconds.     Comments: Baseline hypopigmentation skin changes  Neurological:     General: No focal deficit present.     Mental Status: He is alert and oriented to person, place, and time. Mental status is at baseline.     Cranial Nerves: No  cranial nerve deficit.     Sensory: No sensory deficit.     Motor: No weakness.     Coordination: Coordination normal.  Psychiatric:        Mood and Affect: Mood normal.        Behavior: Behavior normal.    ED Course  Procedures  Medical Decision Making:  Eduardo Weeks is a 61 y.o. male w/ h/o CAD s/p PCI, MR, ischemic CVA, CKD 3, IDDM, HTN, HLD, h/o EtOH/cocaine use, tobacco use, who p/w N/V/D x  2d.   No known sick contacts No abd surgery No suspicious food / ingestion No fever/chills No abd pain, "just the uncomfortable sensation of being hungry" No abd ttp on exam No urinary sxs VSS Sore throat x 3d, no difficulty swallowing or airway concerns Posterior oropharynx erythematous w/o uvular deviation or exudates NBNB emesis 2x/day x 2d Decreased appetite x 2d NB diarhea 1x/day x 2d, last episode a few minutes ago Generalized weakness x 2d  ER provider interpretation of Imaging / Radiology:  Not indicated  ER provider interpretation of EKG:  Not indicated  ER provider interpretation of Labs:  CBC: WBC 8.0, Hgb 17.1 CMP: No emergent electrolyte abnormality, BG 137, BUN 27, Cr 2.49 (slight AKI) Lipase: 28 UA: Small blood, negative nitrite, negative leukocyte, WBC 0-5, rare bacteria - not c/w UTI COVID/flu negative  Key medications administered in the ER:  Medications  sodium chloride 0.9 % bolus 1,000 mL (has no administration in time range)  ondansetron (ZOFRAN-ODT) disintegrating tablet 4 mg (4 mg Oral Given 04/18/21 1057)    Diagnoses considered: Etiology likely viral gastroenteritis. With presenting history and physical exam, doubt bowel obstruction, AAA, ACS, PNA, PTX, pyelonephritis, nephrolithiasis, pancreatitis, cholecystitis, shingles, perforated bowel or ulcer, diverticulosis/diverticulitis, ischemic mesentery, inflammatory bowel disease, strangulated/incarcerated hernia, gastritis, PUD. Patient without peritoneal signs or other indication of need for  surgical intervention.   Consulted: None  Able to tolerate po Will d/c with prn zofran and PCP f/u  Key discharge instructions: Spoke to patient at bedside, all questions were answered at this time, close return precautions given, and patient voiced understanding and agreement with plan. Patient discharged in stable condition.   Patient seen in conjunction with Dr. Benancio Deeds medical dictation software was used in the creation of this note.   Electronically signed by: Wynetta Fines, MD on 04/18/2021 at 9:58 AM  Clinical Impression:  1. Gastroenteritis     Dispo: Discharge    Wynetta Fines, MD 04/19/21 1032    Dorie Rank, MD 04/19/21 [phone]

## 2021-07-15 ENCOUNTER — Ambulatory Visit (INDEPENDENT_AMBULATORY_CARE_PROVIDER_SITE_OTHER): Payer: Medicaid Other | Admitting: Interventional Cardiology

## 2021-07-15 ENCOUNTER — Encounter: Payer: Self-pay | Admitting: Interventional Cardiology

## 2021-07-15 VITALS — BP 132/70 | HR 76 | Ht 69.0 in | Wt 179.0 lb

## 2021-07-15 DIAGNOSIS — N183 Chronic kidney disease, stage 3 unspecified: Secondary | ICD-10-CM | POA: Diagnosis not present

## 2021-07-15 DIAGNOSIS — I10 Essential (primary) hypertension: Secondary | ICD-10-CM

## 2021-07-15 DIAGNOSIS — I2510 Atherosclerotic heart disease of native coronary artery without angina pectoris: Secondary | ICD-10-CM | POA: Diagnosis not present

## 2021-07-15 DIAGNOSIS — E785 Hyperlipidemia, unspecified: Secondary | ICD-10-CM

## 2021-07-15 DIAGNOSIS — I340 Nonrheumatic mitral (valve) insufficiency: Secondary | ICD-10-CM

## 2021-07-15 NOTE — Progress Notes (Signed)
Cardiology Office Note   Date:  07/15/2021   ID:  Eduardo Weeks, DOB 01-29-1961, MRN [id number]  PCP:  Eduardo Polio, NP    No chief complaint on file.  CAD  Wt Readings from Last 3 Encounters:  07/15/21 179 lb (81.2 kg)  01/24/21 184 lb 3.2 oz (83.6 kg)  12/03/20 183 lb (83 kg)       History of Present Illness: Eduardo Weeks is a 61 y.o. male  with history of HTN, HLD, IDDM, CKD 3b, ischemic CVA, hx ETOH/cocaine use, tobacco use, ED, CAD, mitral regurgitation, borderline dilated aortic root in 07/2020, mild carotid artery disease who presents for follow-up. He was in the hospital 07/2020 with hypertensive emergency and stroke. Echo showed EF 55-60% + inferior/inferolateral WMA, severe LVH, mild-moderate MR, borderline dilation of the aortic root. UDS negative. He was discharged on ASA + Plavix (originally with recommendation to drop ASA at 3 weeks). He was admitted 10/2020 with chest pain and underwent cath with 3V CAD with question of whether to proceed with CABG vs PCI.   Cath report showed: "Difficult management options.  CABG is not a good option given that the LAD itself is not significantly disease and the OM branches are not graftable.  His best option is to proceed with right coronary artery PCI and overlapped stents placement.  We will need to treat all the way distally to the proximal segment with overlapped stents.  PCI of the diagonal can then be staged to be done outpatient to minimize risk of contrast-induced nephropathy. "  He ultimately underwent DESx2 to prox/distal RCA then staged DESx2 to diagonal. Repeat echo showed EF 55-60%, mild-moderate MR, aorta normal size. He was discharged on ASA + Plavix. He was also started on Farxiga. At OP f/u he was off lisinopril and advised to restart. F/u Cr was 2.1 (up from 1.63) so this was stopped and he's subsequently followed with pharmD team.  At visit on 01/04/21, low dose amlodipine was started.  BP was controlled  at visit with Dayna in 01/2021.  Doing well since the last visit.  Does Tai Chi and goes fishing.    Denies : Chest pain. Dizziness. Leg edema. Nitroglycerin use. Orthopnea. Palpitations. Paroxysmal nocturnal dyspnea. Shortness of breath. Syncope.    Past Medical History:  Diagnosis Date   Alcohol use    CAD (coronary artery disease)    CKD (chronic kidney disease), stage III (HCC)    Cocaine use    Diabetes mellitus    Dilated aortic root (HCC)    ED (erectile dysfunction)    Hepatitis    Hep C   History of CVA (cerebrovascular accident)    Hypertension    Mild carotid artery disease ([place])    Mitral regurgitation    Mitral regurgitation    Severe left ventricular hypertrophy    Tobacco use     Past Surgical History:  Procedure Laterality Date   BACK SURGERY     bull dozer accident [place] N/A 11/10/2020   Procedure: CORONARY ANGIOGRAPHY;  Surgeon: Early Osmond, MD;  Location: [facility];  Service: Cardiovascular;  Laterality: N/A;   CORONARY ANGIOPLASTY WITH STENT PLACEMENT  11/10/2020   lad 2 stents placed   CORONARY STENT INTERVENTION N/A 11/04/2020   Procedure: CORONARY STENT INTERVENTION;  Surgeon: Leonie Man, MD;  Location: [facility];  Service: Cardiovascular;  Laterality: N/A;   CORONARY STENT INTERVENTION N/A 11/10/2020   Procedure: CORONARY  STENT INTERVENTION;  Surgeon: Early Osmond, MD;  Location: [facility];  Service: Cardiovascular;  Laterality: N/A;   FRACTURE SURGERY     JOINT REPLACEMENT     LEFT HEART CATH AND CORONARY ANGIOGRAPHY N/A 11/03/2020   Procedure: LEFT HEART CATH AND CORONARY ANGIOGRAPHY;  Surgeon: Wellington Hampshire, MD;  Location: [facility];  Service: Cardiovascular;  Laterality: N/A;     Current Outpatient Medications  Medication Sig Dispense Refill   ACCU-CHEK GUIDE test strip USE TO MONITOR BLOOD SUGAR FIVE TIMES DAILY     amLODipine (NORVASC) 2.5 MG tablet Take 1 tablet  (2.5 mg total) by mouth daily. 90 tablet 3   aspirin 81 MG chewable tablet Chew 1 tablet (81 mg total) by mouth daily. 90 tablet 0   carvedilol (COREG) 12.5 MG tablet Take 1 tablet (12.5 mg total) by mouth 2 (two) times daily with a meal. 90 tablet 3   clopidogrel (PLAVIX) 75 MG tablet Take 1 tablet (75 mg total) by mouth daily. 90 tablet 3   dapagliflozin propanediol (FARXIGA) 10 MG TABS tablet Take 1 tablet (10 mg total) by mouth daily before breakfast. 30 tablet 11   gabapentin (NEURONTIN) 800 MG tablet Take 800 mg by mouth 4 (four) times daily.     insulin aspart (NOVOLOG) 100 UNIT/ML injection Inject 0-20 Units into the skin 3 (three) times daily. Per sliding scale  Based on C[facility]  readings     insulin detemir (LEVEMIR) 100 UNIT/ML FlexPen 40-50 Units daily at 6 (six) AM.     metFORMIN (GLUCOPHAGE) 500 MG tablet Take 1 tablet (500 mg total) by mouth 2 (two) times daily with a meal.     nitroGLYCERIN (NITROSTAT) 0.4 MG SL tablet Place 1 tablet (0.4 mg total) under the tongue every 5 (five) minutes as needed for chest pain. 25 tablet 1   pantoprazole (PROTONIX) 40 MG tablet Take 40 mg by mouth daily.     rosuvastatin (CRESTOR) 40 MG tablet Take 1 tablet (40 mg total) by mouth daily. 90 tablet 3   tadalafil (CIALIS) 10 MG tablet Take 10 mg by mouth daily as needed for erectile dysfunction.     tadalafil (CIALIS) 5 MG tablet Take 5 mg by mouth daily as needed.     No current facility-administered medications for this visit.    Allergies:   Onion and Other    Social History:  The patient  reports that he has been smoking cigarettes. He has never used smokeless tobacco. He reports that he does not drink alcohol and does not use drugs.   Family History:  The patient's family history includes Diabetes in his father and mother; Hypertension in his father and mother; Prostate cancer in his father.    ROS:  Please see the history of present illness.   Otherwise, review of systems are positive for  occasional leg pain, not related to walking- no nonhealing sores.   All other systems are reviewed and negative.    PHYSICAL EXAM: VS:  BP 132/70   Pulse 76   Ht 5' 9"  (1.753 m)   Wt 179 lb (81.2 kg)   SpO2 97%   BMI 26.43 kg/m  , BMI Body mass index is 26.43 kg/m. GEN: Well nourished, well developed, in no acute distress HEENT: normal Neck: no JVD, carotid bruits, or masses Cardiac: RRR; no murmurs, rubs, or gallops,no edema  Respiratory:  clear to auscultation bilaterally, normal work of breathing GI: soft, nontender, nondistended, + BS  MS: no deformity or atrophy Skin: warm and dry, no rash Neuro:  Strength and sensation are intact Psych: euthymic mood, full affect   EKG:   The ekg ordered 11/2020 demonstrates NSR, nonspecific ST changes   Recent Labs: 01/24/2021: TSH 1.830 04/18/2021: ALT 19; BUN 27; Creatinine, Ser 2.49; Hemoglobin 17.1; Platelets 207; Potassium 4.0; Sodium 133   Lipid Panel    Component Value Date/Time   CHOL 122 11/03/2020 0321   TRIG 71 11/03/2020 0321   HDL 38 (L) 11/03/2020 0321   CHOLHDL 3.2 11/03/2020 0321   VLDL 14 11/03/2020 0321   LDLCALC 70 11/03/2020 0321     Other studies Reviewed: Additional studies/ records that were reviewed today with results demonstrating: labs reviewed.   ASSESSMENT AND PLAN:  CAD/old MI: No angina on medical therapy. COntinue clopidogrel with aspirin given diffuse CAD.  Hyperlipidemia: LDL 70 triglycerides 71 HDL 38 total cholesterol 122 in September 2022.  Continue rosuvastatin.  Well-controlled. Hypertension: The current medical regimen is effective;  continue present plan and medications.  No high readings of late.  Home readings are in the 130s typically. Mitral regurgitation: No signs of CHF.  Mild to moderate MR in the past.    Current medicines are reviewed at length with the patient today.  The patient concerns regarding his medicines were addressed.  The following changes have been made:  No  change  Labs/ tests ordered today include:  No orders of the defined types were placed in this encounter.   Recommend 150 minutes/week of aerobic exercise Low fat, low carb, high fiber diet recommended  Disposition:   FU in 6 months   Signed, Larae Grooms, MD  07/15/2021 9:00 AM    [facility] [address], [place], [place]  [address] Phone: [phone]; Fax: [phone]

## 2021-07-15 NOTE — Patient Instructions (Signed)
Medication Instructions:  Your physician recommends that you continue on your current medications as directed. Please refer to the Current Medication list given to you today.  *If you need a refill on your cardiac medications before your next appointment, please call your pharmacy*   Lab Work: none If you have labs (blood work) drawn today and your tests are completely normal, you will receive your results only by: MyChart Message (if you have MyChart) OR A paper copy in the mail If you have any lab test that is abnormal or we need to change your treatment, we will call you to review the results.   Testing/Procedures: none   Follow-Up: At [facility], you and your health needs are our priority.  As part of our continuing mission to provide you with exceptional heart care, we have created designated Provider Care Teams.  These Care Teams include your primary Cardiologist (physician) and Advanced Practice Providers (APPs -  Physician Assistants and Nurse Practitioners) who all work together to provide you with the care you need, when you need it.  We recommend signing up for the patient portal called "MyChart".  Sign up information is provided on this After Visit Summary.  MyChart is used to connect with patients for Virtual Visits (Telemedicine).  Patients are able to view lab/test results, encounter notes, upcoming appointments, etc.  Non-urgent messages can be sent to your provider as well.   To learn more about what you can do with MyChart, go to [email or web address].    Your next appointment:   01/17/22 at 8:00  The format for your next appointment:   In Person  Provider:   Lance Muss, MD     Other Instructions    Important Information About Sugar

## 2022-01-17 ENCOUNTER — Ambulatory Visit: Payer: Medicaid Other | Admitting: Interventional Cardiology

## 2022-02-01 ENCOUNTER — Telehealth: Payer: Self-pay | Admitting: Interventional Cardiology

## 2022-02-01 MED ORDER — DAPAGLIFLOZIN PROPANEDIOL 10 MG PO TABS: 10.0000 mg | ORAL_TABLET | Freq: Every day | ORAL | 1 refills | Status: DC

## 2022-02-01 NOTE — Telephone Encounter (Signed)
*  STAT* If patient is at the pharmacy, call can be transferred to refill team.   1. Which medications need to be refilled? (please list name of each medication and dose if known) dapagliflozin propanediol (FARXIGA) 10 MG TABS tablet   2. Which pharmacy/location (including street and city if local pharmacy) is medication to be sent to? [facility] - [place], [place] - [address]   3. Do they need a 30 day or 90 day supply? 90

## 2022-02-01 NOTE — Telephone Encounter (Signed)
Rx sent 

## 2022-03-16 ENCOUNTER — Ambulatory Visit: Payer: Medicaid Other | Admitting: Nurse Practitioner

## 2022-03-23 ENCOUNTER — Ambulatory Visit: Payer: Medicaid Other | Attending: Interventional Cardiology | Admitting: Nurse Practitioner

## 2022-03-23 ENCOUNTER — Encounter: Payer: Self-pay | Admitting: Nurse Practitioner

## 2022-03-23 VITALS — BP 110/70 | HR 85 | Ht 69.0 in | Wt 174.6 lb

## 2022-03-23 DIAGNOSIS — I340 Nonrheumatic mitral (valve) insufficiency: Secondary | ICD-10-CM | POA: Diagnosis present

## 2022-03-23 DIAGNOSIS — I2510 Atherosclerotic heart disease of native coronary artery without angina pectoris: Secondary | ICD-10-CM | POA: Insufficient documentation

## 2022-03-23 DIAGNOSIS — I10 Essential (primary) hypertension: Secondary | ICD-10-CM | POA: Diagnosis present

## 2022-03-23 DIAGNOSIS — E785 Hyperlipidemia, unspecified: Secondary | ICD-10-CM | POA: Insufficient documentation

## 2022-03-23 NOTE — Progress Notes (Signed)
Cardiology Office Note:    Date:  03/23/2022   ID:  Eduardo Weeks, DOB 07/03/1960, MRN [id number]  PCP:  Nena Polio, NP   [facility] Providers Cardiologist:  Larae Grooms, MD     Referring MD: Nena Polio, NP   Chief Complaint: follow-up CAD  History of Present Illness:    Eduardo Weeks is a pleasant  62 y.o. male with a hx of HTN, HLD, IDDM, CKD stage IIIb, ischemic CVA, history EtOH/cocaine use, tobacco use, CAD, mitral regurgitation, borderline dilated aortic root 07/2020, mild carotid artery disease.  Hospitalization 07/2020 with hypertensive emergency and stroke. Echo showed EF 55 to 60%, inferior/inferior lateral WMA, severe LVH, mild to moderate MR, borderline dilatation of the aortic root.  UDS negative. He was discharged on aspirin and Plavix (originally with recommendation to drop ASA at 3 weeks).  Admission 10/2020 with chest pain and underwent cath with 3V CAD with question of whether to proceed with CABG versus PCI.  Cath report showed "difficult management options.  CABG is not a good option given that the LAD itself is not significantly diseased and the OM branches are not graftable.  His best option is to proceed with right coronary artery PCI and overlapped stents placement.  We will need to treat all the way distally to the proximal segment with overlapping stents.  PCI of the diagonal can be staged to be done outpatient to minimize risk of contrast-induced nephropathy."  He ultimately underwent DES x 2 to proximal/distal RCA and staged DES x 2 to diagonal.  Repeat echo showed EF 55 to 60%, mild to moderate MR, aorta normal size.  He was discharged on ASA plus Plavix.  He was also started on Farxiga.  At outpatient follow-up he was off lisinopril and advised to restart.  Follow-up SCr was 2.1 (up from 1.63) so this was stopped and he subsequently followed with Pharm.D.  At visit on 01/04/2021, low-dose amlodipine was started. BP was well-controlled  at next office visit.  Last cardiology clinic visit was 07/15/2021 with Dr. Irish Lack.  He was overall doing well from a cardiac perspective and no changes were made to treatment regimen. He was advised to return in 6 months for follow-up.  Today, he is here alone for follow-up. Reports he is overall feeling well. Says this is his nap time, he is usually tired during the afternoons. 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 Tries to walk daily for exercise can walk > 30 minutes, prefers to walk inside. Has neuropathy in his feet, cold weather makes them feel worse. Has occasional lightheadedness, no presyncope or syncope.  He reports occasional chest pains that feel like angina he had at time of MI. These do not occur frequently and are not associated with shortness of breath, diaphoresis, n/v. Pain does not intensify with exertion. No orthopnea, edema, or PND. No bleeding concerns. We reviewed medications, he reports no problems.   Past Medical History:  Diagnosis Date   Alcohol use    CAD (coronary artery disease)    CKD (chronic kidney disease), stage III (HCC)    Cocaine use    Diabetes mellitus    Dilated aortic root (HCC)    ED (erectile dysfunction)    Hepatitis    Hep C   History of CVA (cerebrovascular accident)    Hypertension    Mild carotid artery disease (HCC)    Mitral regurgitation    Mitral regurgitation    Severe left ventricular hypertrophy    Tobacco  use     Past Surgical History:  Procedure Laterality Date   BACK SURGERY     bull dozer accident 70   CORONARY ANGIOGRAPHY N/A 11/10/2020   Procedure: CORONARY ANGIOGRAPHY;  Surgeon: Orbie Pyo, MD;  [facility];  Service: Cardiovascular;  Laterality: N/A;   CORONARY ANGIOPLASTY WITH STENT PLACEMENT  11/10/2020   lad 2 stents placed   CORONARY STENT INTERVENTION N/A 11/04/2020   Procedure: CORONARY STENT INTERVENTION;  Surgeon: Marykay Lex, MD;  Location: [facility]VAS[facility] CV LAB;  Service:  Cardiovascular;  Laterality: N/A;   CORONARY STENT INTERVENTION N/A 11/10/2020   Procedure: CORONARY STENT INTERVENTION;  Surgeon: Orbie Pyo, MD;  Location: [facility] INVASIVE CV LAB;  Service: Cardiovascular;  Laterality: N/A;   FRACTURE SURGERY     JOINT REPLACEMENT     LEFT HEART CATH AND CORONARY ANGIOGRAPHY N/A 11/03/2020   Procedure: LEFT HEART CATH AND CORONARY ANGIOGRAPHY;  Surgeon: Iran Ouch, MD;  Location: [facility] INVASIVE CV LAB;  Service: Cardiovascular;  Laterality: N/A;    Current Medications: Current Meds  Medication Sig   ACCU-CHEK GUIDE test strip USE TO MONITOR BLOOD SUGAR FIVE TIMES DAILY   aspirin 81 MG chewable tablet Chew 1 tablet (81 mg total) by mouth daily.   carvedilol (COREG) 12.5 MG tablet Take 1 tablet (12.5 mg total) by mouth 2 (two) times daily with a meal.   clopidogrel (PLAVIX) 75 MG tablet Take 1 tablet (75 mg total) by mouth daily.   dapagliflozin propanediol (FARXIGA) 10 MG TABS tablet Take 1 tablet (10 mg total) by mouth daily before breakfast.   gabapentin (NEURONTIN) 800 MG tablet Take 800 mg by mouth 4 (four) times daily.   insulin aspart (NOVOLOG) 100 UNIT/ML injection Inject 0-20 Units into the skin 3 (three) times daily. Per sliding scale  Based on [facility]  readings orders Per patient taking 2-3 units sliding scale.   insulin detemir (LEVEMIR) 100 UNIT/ML FlexPen 40-50 Units daily at 6 (six) AM. Per patient taking 40 units   lisinopril (ZESTRIL) 10 MG tablet Take 10 mg by mouth daily.   metFORMIN (GLUCOPHAGE) 500 MG tablet Take 1 tablet (500 mg total) by mouth 2 (two) times daily with a meal.   pantoprazole (PROTONIX) 40 MG tablet Take 40 mg by mouth daily.   rosuvastatin (CRESTOR) 40 MG tablet Take 1 tablet (40 mg total) by mouth daily.   tadalafil (CIALIS) 10 MG tablet Take 10 mg by mouth daily as needed for erectile dysfunction.   [DISCONTINUED] amLODipine (NORVASC) 2.5 MG tablet Take 1 tablet (2.5 mg total) by mouth daily.   [DISCONTINUED]  tadalafil (CIALIS) 5 MG tablet Take 5 mg by mouth daily as needed.     Allergies:   Onion and Other   Social History   Socioeconomic History   Marital status: Single    Spouse name: Not on file   Number of children: Not on file   Years of education: Not on file   Highest education level: Not on file  Occupational History   Not on file  Tobacco Use   Smoking status: Some Days    Types: Cigarettes    Last attempt to quit: 08/21/2011    Years since quitting: 10.5   Smokeless tobacco: Never  Substance and Sexual Activity   Alcohol use: No    Comment: quit drinking 2 months ago   Drug use: No   Sexual activity: Not on file  Other Topics Concern   Not  on file  Social History Narrative   Not on file   Social Determinants of Health   Financial Resource Strain: Not on file  Food Insecurity: Not on file  Transportation Needs: Not on file  Physical Activity: Not on file  Stress: Not on file  Social Connections: Not on file     Family History: The patient's family history includes Diabetes in his father and mother; Hypertension in his father and mother; Prostate cancer in his father.  ROS:   Please see the history of present illness.  All other systems reviewed and are negative.  Labs/Other Studies Reviewed:    The following studies were reviewed today:  Echo 11/03/20 1. Left ventricular ejection fraction, by estimation, is 55 to 60%. The  left ventricle has normal function. The left ventricle has no definite  regional wall motion abnormalities. There is moderate left ventricular  hypertrophy. Left ventricular diastolic   parameters were normal.   2. Right ventricular systolic function is normal. The right ventricular  size is normal. Tricuspid regurgitation signal is inadequate for assessing  PA pressure.   3. The mitral valve is grossly normal/mildly degenerative. Mild to  moderate mitral valve regurgitation. No evidence of mitral stenosis.   4. The aortic valve is  tricuspid. Aortic valve regurgitation is not  visualized. No aortic stenosis is present.   5. The inferior vena cava is normal in size with greater than 50%  respiratory variability, suggesting right atrial pressure of 3 mmHg.    Coronary Stent Intervention 11/11/20   A drug-eluting stent was successfully placed using a SYNERGY XD 2.50X48.   A drug-eluting stent was successfully placed using a SYNERGY XD 3.0X12.   Post intervention, there is a 0% residual stenosis.    Successful PCI of diagonal with two overlapping drug-eluting stents. Widely patent RCA stents.   Recommendations:  Dual antiplatelet therapy for at least one year and aggressive medical management for cardiovascular disease.  Coronary Stent Intervention 11/04/20 Successful DES PCI of proximal to distal RCA covering all lesions: 2 overlapping DES  Synergy XD DES 2.75 mm x 48 mm (mid-distal),  Synergy XD DES 3.0 mm x 38 mm (proximal-mid)  Stented segment postdilated in tapered fashion from 3.1 to 2.9 mm.     Recommendations He will be transferred to 6 E progressive unit for post PCI care Weight-based post cath hydration x10 hours-recheck creatinine in the morning. Anticipate staged PCI of the diagonal branch next week.  Can be scheduled on discharge.    LHC 11/03/20 Prox Cx to Dist Cx lesion is 100% stenosed.   Dist LAD lesion is 80% stenosed.   2nd Diag lesion is 90% stenosed.   1st Diag lesion is 80% stenosed.   Prox RCA lesion is 90% stenosed.   Mid RCA lesion is 60% stenosed.   Dist RCA lesion is 70% stenosed.   RPDA lesion is 80% stenosed.   1.  Significant three-vessel coronary artery disease with chronic occlusion of the mid left circumflex with minimal collaterals to the OM branches, significant diffuse disease affecting a large first diagonal and severe diffuse disease affecting the right coronary artery from the proximal segment all the way to the PDA/PLA bifurcation with significant right PDA disease. 2.   Left ventricular angiography was not performed due to chronic kidney disease.  Mildly elevated left ventricular end-diastolic pressure.   Recommendations: Difficult management options.  CABG is not a good option given that the LAD itself is not significantly disease and the OM branches  are not graftable.  His best option is to proceed with right coronary artery PCI and overlapped stents placement.  We will need to treat all the way distally to the proximal segment with overlapped stents.  PCI of the diagonal can then be staged to be done outpatient to minimize risk of contrast-induced nephropathy.  I discussed the case with Dr. Irish Lack.  I will hydrate him today and recheck renal function tomorrow.  Diagnostic Dominance: Right  Intervention    Recent Labs: 04/18/2021: ALT 19; BUN 27; Creatinine, Ser 2.49; Hemoglobin 17.1; Platelets 207; Potassium 4.0; Sodium 133  Recent Lipid Panel    Component Value Date/Time   CHOL 122 11/03/2020 0321   TRIG 71 11/03/2020 0321   HDL 38 (L) 11/03/2020 0321   CHOLHDL 3.2 11/03/2020 0321   VLDL 14 11/03/2020 0321   LDLCALC 70 11/03/2020 0321     Risk Assessment/Calculations:      Physical Exam:    VS:  BP 110/70   Pulse 85   Ht 5\' 9"  (1.753 m)   Wt 174 lb 9.6 oz (79.2 kg)   SpO2 97%   BMI 25.78 kg/m     Wt Readings from Last 3 Encounters:  03/23/22 174 lb 9.6 oz (79.2 kg)  07/15/21 179 lb (81.2 kg)  01/24/21 184 lb 3.2 oz (83.6 kg)     GEN:  Well nourished, well developed in no acute distress HEENT: Normal NECK: No JVD; No carotid bruits CARDIAC: RRR, no murmurs, rubs, gallops RESPIRATORY:  Clear to auscultation without rales, wheezing or rhonchi  ABDOMEN: Soft, non-tender, non-distended MUSCULOSKELETAL:  No edema; No deformity. 2+ pedal pulses, equal bilaterally SKIN: Warm and dry NEUROLOGIC:  Alert and oriented x 3 PSYCHIATRIC:  Normal affect   EKG:  EKG is ordered today.  The ekg ordered today demonstrates normal sinus rhythm  at 85 bpm, nonspecific T wave abnormality, no acute change from previous tracing   Diagnoses:    1. Coronary artery disease involving native coronary artery of native heart without angina pectoris   2. Hyperlipidemia LDL goal <70   3. Essential hypertension   4. Mitral valve insufficiency, unspecified etiology    Assessment and Plan:     CAD without angina: Staged PCI with DES x 2 to proximal/distal RCA then DES x 2 to diagonal.  Occasional left sided chest pain. This does not occur frequently, is not worsened with exertion, and is not accompanied by additional symptoms. We discussed medical management of CAD. He does not feel this is significant and does not want to take any additional medications. No bleeding problems on DAPT.  Continue aspirin, carvedilol, clopidogrel, Crestor  Hyperlipidemia LDL goal < 70: LDL 57 on 11/07/21. Continue Crestor.   Hypertension: BP is well controlled  Mitral regurgitation: Mild to moderate MR on echo 10/2020. He is asymptomatic. I do not appreciate a murmur on exam.  We will continue to follow clinically for now.     Disposition: 6 months with Dr. Irish Lack  Medication Adjustments/Labs and Tests Ordered: Current medicines are reviewed at length with the patient today.  Concerns regarding medicines are outlined above.  Orders Placed This Encounter  Procedures   EKG 12-Lead   No orders of the defined types were placed in this encounter.   Patient Instructions  Medication Instructions:   Your physician recommends that you continue on your current medications as directed. Please refer to the Current Medication list given to you today.   *If you need a refill on your  cardiac medications before your next appointment, please call your pharmacy*   Lab Work:  None ordered.  If you have labs (blood work) drawn today and your tests are completely normal, you will receive your results only by: [place] (if you have MyChart) OR A paper copy in  the mail If you have any lab test that is abnormal or we need to change your treatment, we will call you to review the results.   Testing/Procedures:  None ordered.   Follow-Up: At [facility], you and your health needs are our priority.  As part of our continuing mission to provide you with exceptional heart care, we have created designated Provider Care Teams.  These Care Teams include your primary Cardiologist (physician) and Advanced Practice Providers (APPs -  Physician Assistants and Nurse Practitioners) who all work together to provide you with the care you need, when you need it.  We recommend signing up for the patient portal called "MyChart".  Sign up information is provided on this After Visit Summary.  MyChart is used to connect with patients for Virtual Visits (Telemedicine).  Patients are able to view lab/test results, encounter notes, upcoming appointments, etc.  Non-urgent messages can be sent to your provider as well.   To learn more about what you can do with MyChart, go to [email or web address].    Your next appointment:   6 month(s)  Provider:   Larae Grooms, MD        Signed, Emmaline Life, NP  03/23/2022 4:35 PM    [place]

## 2022-03-23 NOTE — Patient Instructions (Signed)
Medication Instructions:   Your physician recommends that you continue on your current medications as directed. Please refer to the Current Medication list given to you today.   *If you need a refill on your cardiac medications before your next appointment, please call your pharmacy*   Lab Work:  None ordered.  If you have labs (blood work) drawn today and your tests are completely normal, you will receive your results only by: [place] (if you have MyChart) OR A paper copy in the mail If you have any lab test that is abnormal or we need to change your treatment, we will call you to review the results.   Testing/Procedures:  None ordered.   Follow-Up: At [facility], you and your health needs are our priority.  As part of our continuing mission to provide you with exceptional heart care, we have created designated Provider Care Teams.  These Care Teams include your primary Cardiologist (physician) and Advanced Practice Providers (APPs -  Physician Assistants and Nurse Practitioners) who all work together to provide you with the care you need, when you need it.  We recommend signing up for the patient portal called "MyChart".  Sign up information is provided on this After Visit Summary.  MyChart is used to connect with patients for Virtual Visits (Telemedicine).  Patients are able to view lab/test results, encounter notes, upcoming appointments, etc.  Non-urgent messages can be sent to your provider as well.   To learn more about what you can do with MyChart, go to [email or web address].    Your next appointment:   6 month(s)  Provider:   Larae Grooms, MD

## 2022-07-21 ENCOUNTER — Other Ambulatory Visit: Payer: Self-pay | Admitting: Interventional Cardiology

## 2022-10-01 NOTE — Progress Notes (Deleted)
Cardiology Office Note   Date:  10/01/2022   ID:  Eduardo Weeks, DOB 02/19/1960, MRN [id number]  PCP:  Primus Bravo, NP    No chief complaint on file.  CAD  Wt Readings from Last 3 Encounters:  03/23/22 174 lb 9.6 oz (79.2 kg)  07/15/21 179 lb (81.2 kg)  01/24/21 184 lb 3.2 oz (83.6 kg)       History of Present Illness: Eduardo Weeks is a 62 y.o. male  with history of HTN, HLD, IDDM, CKD 3b, ischemic CVA, hx ETOH/cocaine use, tobacco use, ED, CAD, mitral regurgitation, borderline dilated aortic root in 07/2020, mild carotid artery disease who presents for follow-up. He was in the hospital 07/2020 with hypertensive emergency and stroke. Echo showed EF 55-60% + inferior/inferolateral WMA, severe LVH, mild-moderate MR, borderline dilation of the aortic root. UDS negative. He was discharged on ASA + Plavix (originally with recommendation to drop ASA at 3 weeks). He was admitted 10/2020 with chest pain and underwent cath with 3V CAD with question of whether to proceed with CABG vs PCI.    Cath report showed: "Difficult management options.  CABG is not a good option given that the LAD itself is not significantly disease and the OM branches are not graftable.  His best option is to proceed with right coronary artery PCI and overlapped stents placement.  We will need to treat all the way distally to the proximal segment with overlapped stents.  PCI of the diagonal can then be staged to be done outpatient to minimize risk of contrast-induced nephropathy. "   He ultimately underwent DESx2 to prox/distal RCA then staged DESx2 to diagonal. Repeat echo showed EF 55-60%, mild-moderate MR, aorta normal size. He was discharged on ASA + Plavix. He was also started on Farxiga. At OP f/u he was off lisinopril and advised to restart. F/u Cr was 2.1 (up from 1.63) so this was stopped and he's subsequently followed with pharmD team.  At visit on 01/04/21, low dose amlodipine was started.   BP was  controlled at visit with Dayna in 01/2021.   In 6/23: "Doing well since the last visit. Does Tai Chi and goes fishing.  "    Past Medical History:  Diagnosis Date   Alcohol use    CAD (coronary artery disease)    CKD (chronic kidney disease), stage III (HCC)    Cocaine use    Diabetes mellitus    Dilated aortic root (HCC)    ED (erectile dysfunction)    Hepatitis    Hep C   History of CVA (cerebrovascular accident)    Hypertension    Mild carotid artery disease (HCC)    Mitral regurgitation    Mitral regurgitation    Severe left ventricular hypertrophy    Tobacco use     Past Surgical History:  Procedure Laterality Date   BACK SURGERY     bull dozer accident 37   CORONARY ANGIOGRAPHY N/A 11/10/2020   Procedure: CORONARY ANGIOGRAPHY;  Surgeon: Orbie Pyo, MD;  Location: [facility] INVASIVE CV LAB;  Service: Cardiovascular;  Laterality: N/A;   CORONARY ANGIOPLASTY WITH STENT PLACEMENT  11/10/2020   lad 2 stents placed   CORONARY STENT INTERVENTION N/A 11/04/2020   Procedure: CORONARY STENT INTERVENTION;  Surgeon: Marykay Lex, MD;  [facility] INVASIVE CV LAB;  Service: Cardiovascular;  Laterality: N/A;   CORONARY STENT INTERVENTION N/A 11/10/2020   Procedure: CORONARY STENT INTERVENTION;  Surgeon: Orbie Pyo, MD;  Location: [facility] INVASIVE  CV LAB;  Service: Cardiovascular;  Laterality: N/A;   FRACTURE SURGERY     JOINT REPLACEMENT     LEFT HEART CATH AND CORONARY ANGIOGRAPHY N/A 11/03/2020   Procedure: LEFT HEART CATH AND CORONARY ANGIOGRAPHY;  Surgeon: Iran Ouch, MD;  Location: [facility] INVASIVE CV LAB;  Service: Cardiovascular;  Laterality: N/A;     Current Outpatient Medications  Medication Sig Dispense Refill   ACCU-CHEK GUIDE test strip USE TO MONITOR BLOOD SUGAR FIVE TIMES DAILY     aspirin 81 MG chewable tablet Chew 1 tablet (81 mg total) by mouth daily. 90 tablet 0   carvedilol (COREG) 12.5 MG tablet Take 1 tablet (12.5 mg total) by mouth 2 (two) times  daily with a meal. 90 tablet 3   clopidogrel (PLAVIX) 75 MG tablet Take 1 tablet (75 mg total) by mouth daily. 90 tablet 3   dapagliflozin propanediol (FARXIGA) 10 MG TABS tablet TAKE 1 TABLET BY MOUTH ONCE DAILY BEFORE BREAKFAST 90 tablet 2   gabapentin (NEURONTIN) 800 MG tablet Take 800 mg by mouth 4 (four) times daily.     insulin aspart (NOVOLOG) 100 UNIT/ML injection Inject 0-20 Units into the skin 3 (three) times daily. Per sliding scale  Based on [facility]  readings orders Per patient taking 2-3 units sliding scale.     insulin detemir (LEVEMIR) 100 UNIT/ML FlexPen 40-50 Units daily at 6 (six) AM. Per patient taking 40 units     lisinopril (ZESTRIL) 10 MG tablet Take 10 mg by mouth daily.     metFORMIN (GLUCOPHAGE) 500 MG tablet Take 1 tablet (500 mg total) by mouth 2 (two) times daily with a meal.     nitroGLYCERIN (NITROSTAT) 0.4 MG SL tablet Place 1 tablet (0.4 mg total) under the tongue every 5 (five) minutes as needed for chest pain. 25 tablet 1   pantoprazole (PROTONIX) 40 MG tablet Take 40 mg by mouth daily.     rosuvastatin (CRESTOR) 40 MG tablet Take 1 tablet (40 mg total) by mouth daily. 90 tablet 3   tadalafil (CIALIS) 10 MG tablet Take 10 mg by mouth daily as needed for erectile dysfunction.     No current facility-administered medications for this visit.    Allergies:   Onion and Other    Social History:  The patient  reports that he has been smoking cigarettes. He has never used smokeless tobacco. He reports that he does not drink alcohol and does not use drugs.   Family History:  The patient's ***family history includes Diabetes in his father and mother; Hypertension in his father and mother; Prostate cancer in his father.    ROS:  Please see the history of present illness.   Otherwise, review of systems are positive for ***.   All other systems are reviewed and negative.    PHYSICAL EXAM: VS:  There were no vitals taken for this visit. , BMI There is no height or weight  on file to calculate BMI. GEN: Well nourished, well developed, in no acute distress HEENT: normal Neck: no JVD, carotid bruits, or masses Cardiac: ***RRR; no murmurs, rubs, or gallops,no edema  Respiratory:  clear to auscultation bilaterally, normal work of breathing GI: soft, nontender, nondistended, + BS MS: no deformity or atrophy Skin: warm and dry, no rash Neuro:  Strength and sensation are intact Psych: euthymic mood, full affect   EKG:   The ekg ordered today demonstrates ***   Recent Labs: No results found for requested labs within last 365 days.  Lipid Panel    Component Value Date/Time   CHOL 122 11/03/2020 0321   TRIG 71 11/03/2020 0321   HDL 38 (L) 11/03/2020 0321   CHOLHDL 3.2 11/03/2020 0321   VLDL 14 11/03/2020 0321   LDLCALC 70 11/03/2020 0321     Other studies Reviewed: Additional studies/ records that were reviewed today with results demonstrating: ***.   ASSESSMENT AND PLAN:  CAD/old MI: Hyperlipidemia: Hypertension: Mitral regurgitation: Mild to moderate mitral regurgitation noted in the past. Stage III CKD: Avoid nephrotoxins.  Stay well-hydrated.    Current medicines are reviewed at length with the patient today.  The patient concerns regarding his medicines were addressed.  The following changes have been made:  No change***  Labs/ tests ordered today include: *** No orders of the defined types were placed in this encounter.   Recommend 150 minutes/week of aerobic exercise Low fat, low carb, high fiber diet recommended  Disposition:   FU in ***   Signed, Lance Muss, MD  10/01/2022 11:54 PM    [facility] [address] [place], [place], [place]  [address] Phone: [phone]; Fax: [phone]

## 2022-10-02 ENCOUNTER — Ambulatory Visit: Payer: MEDICAID | Attending: Interventional Cardiology | Admitting: Interventional Cardiology

## 2022-10-02 ENCOUNTER — Encounter: Payer: Self-pay | Admitting: Interventional Cardiology

## 2022-10-02 DIAGNOSIS — N183 Chronic kidney disease, stage 3 unspecified: Secondary | ICD-10-CM

## 2022-10-02 DIAGNOSIS — I2510 Atherosclerotic heart disease of native coronary artery without angina pectoris: Secondary | ICD-10-CM

## 2022-10-02 DIAGNOSIS — E785 Hyperlipidemia, unspecified: Secondary | ICD-10-CM

## 2022-10-02 DIAGNOSIS — I10 Essential (primary) hypertension: Secondary | ICD-10-CM

## 2022-11-28 IMAGING — CT CT HEAD W/O CM
4 series · 16 of 47 positions shown, 18 images · non-contrast
Comparison: CT brain 02/26/2017

CLINICAL DATA: Worsening headache with hypertension

EXAM:
CT HEAD WITHOUT CONTRAST
TECHNIQUE: Contiguous axial images were obtained from the base of the skull
through the vertex without intravenous contrast.

[Series 3: head without · axial · non-contrast · 0.41mm/px · z∈[-176,-66]mm · 7 of 30 slices shown, 9 images]
[im 4/30  brain]
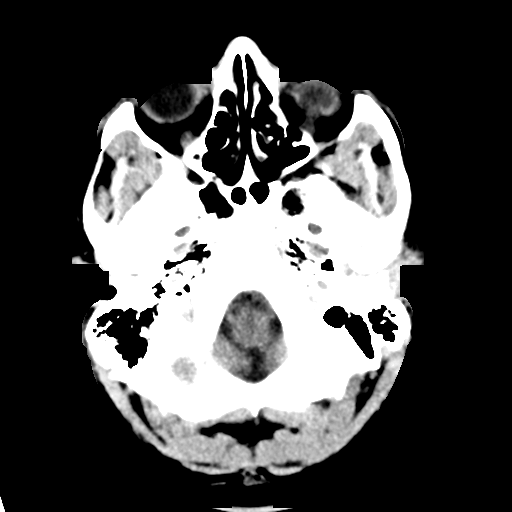
[im 4/30  bone]
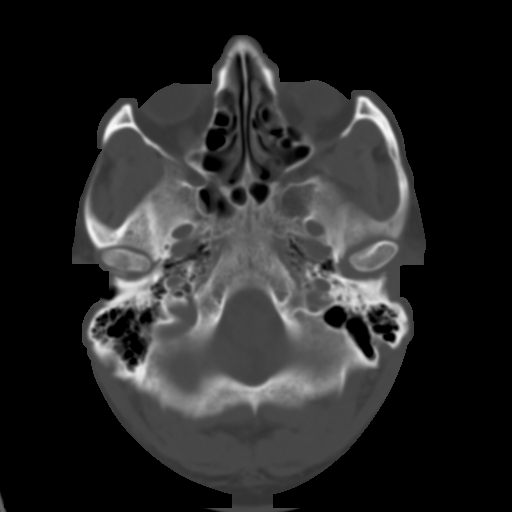
[im 8/30  brain]
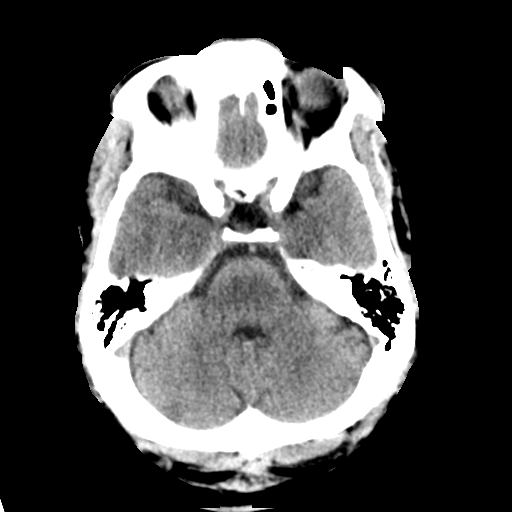
[im 11/30  brain]
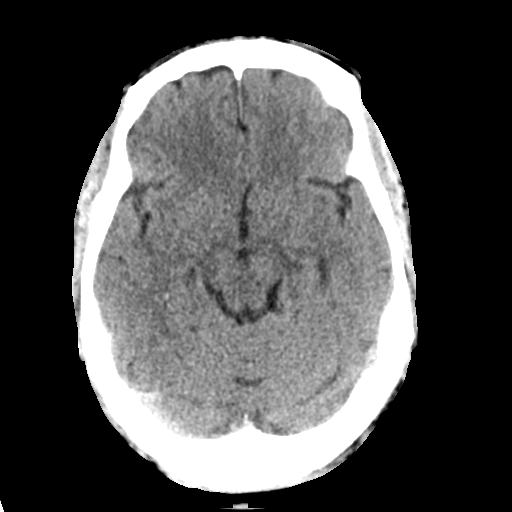
[im 15/30  brain]
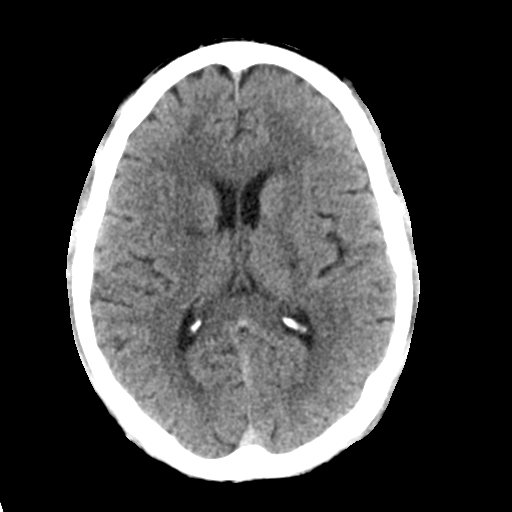
[im 19/30  brain]
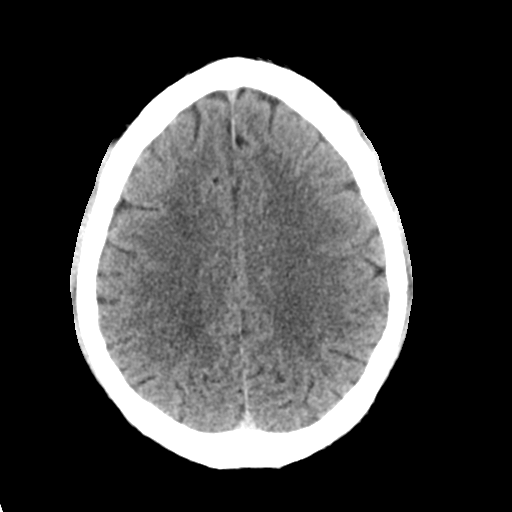
[im 19/30  bone]
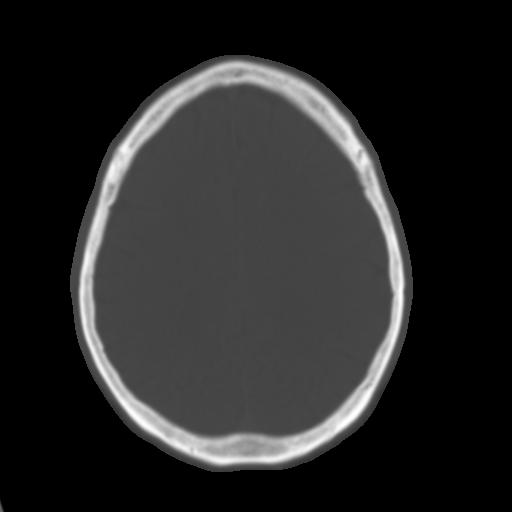
[im 22/30  brain]
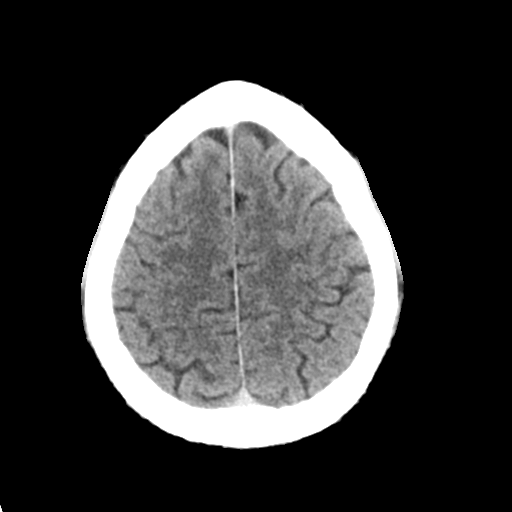
[im 26/30  brain]
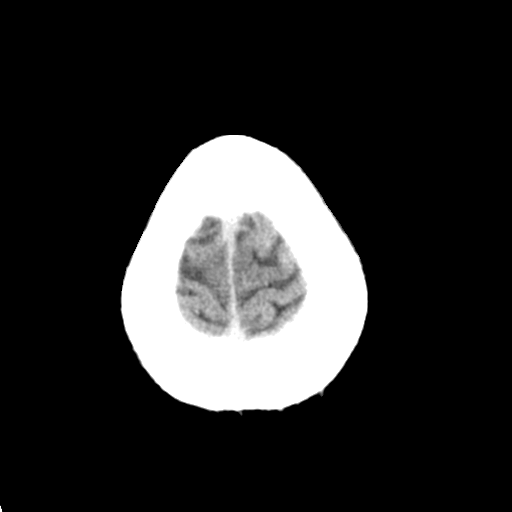

[Series 4: head bone · axial · 0.41mm/px · z∈[-176,-148]mm · 3 of 74 slices shown]
[im 8/74  bone]
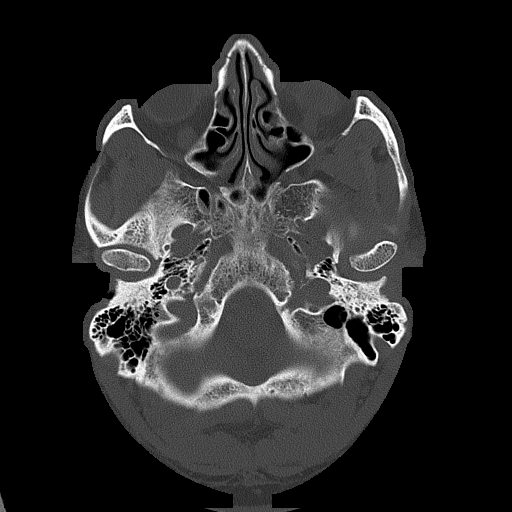
[im 15/74  bone]
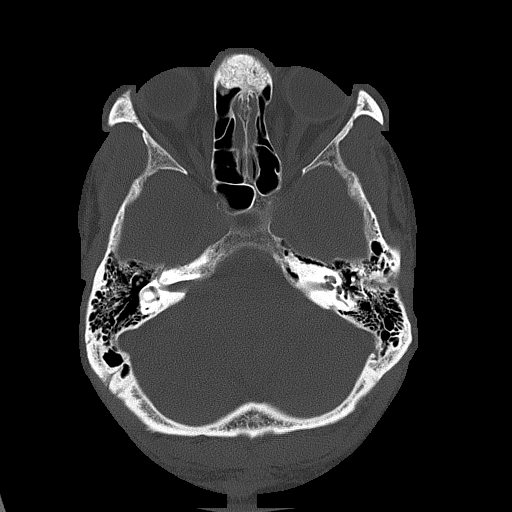
[im 22/74  bone]
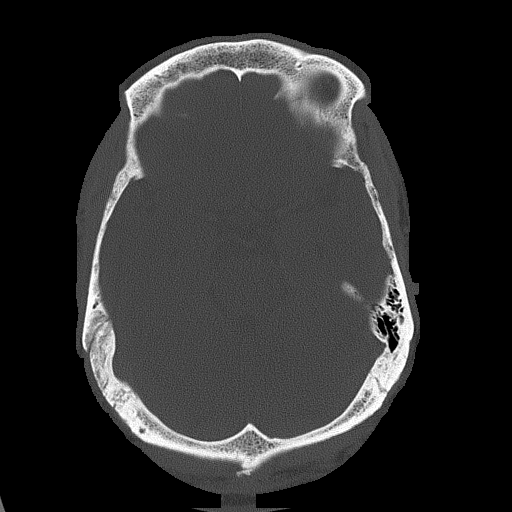

[Series 5: head without cor · coronal · non-contrast · 0.29mm/px · 3 of 67 slices shown]
[im 23/67  brain]
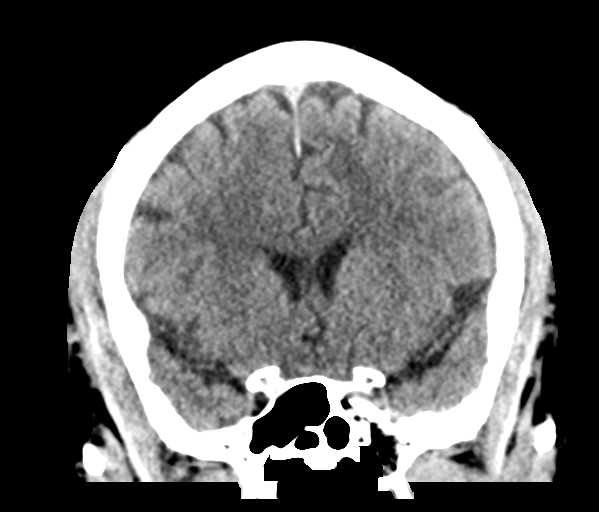
[im 30/67  brain]
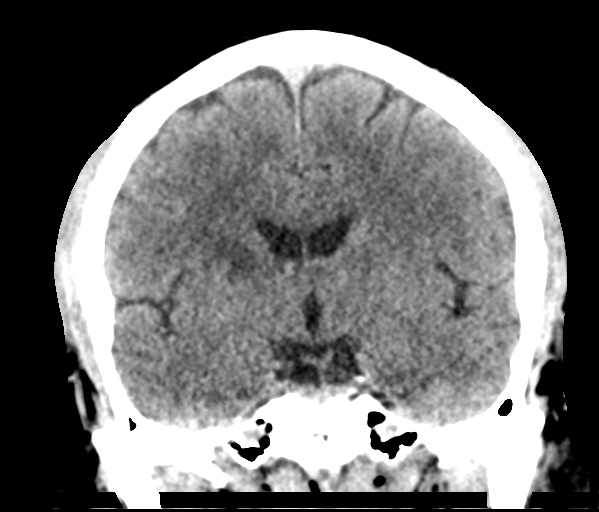
[im 37/67  brain]
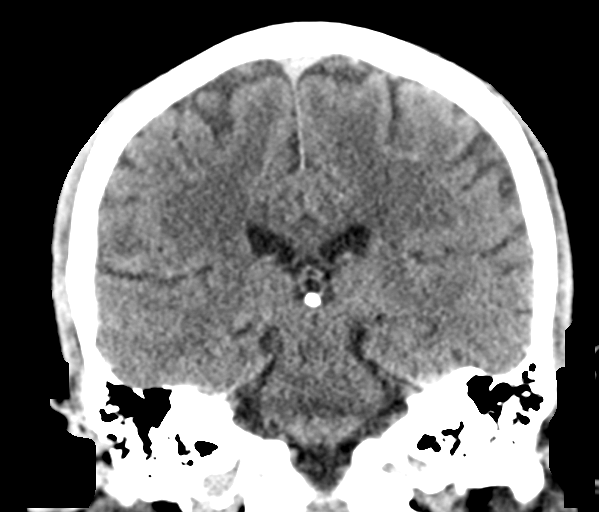

[Series 6: head without sag · sagittal · non-contrast · 0.31mm/px · 3 of 58 slices shown]
[im 20/58  brain]
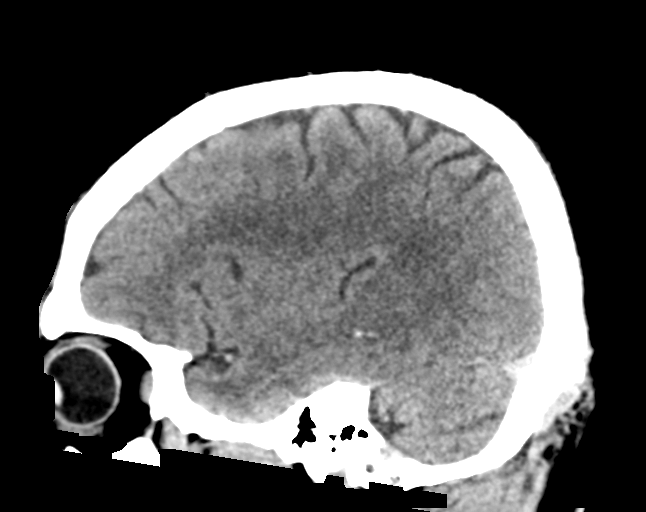
[im 29/58  brain]
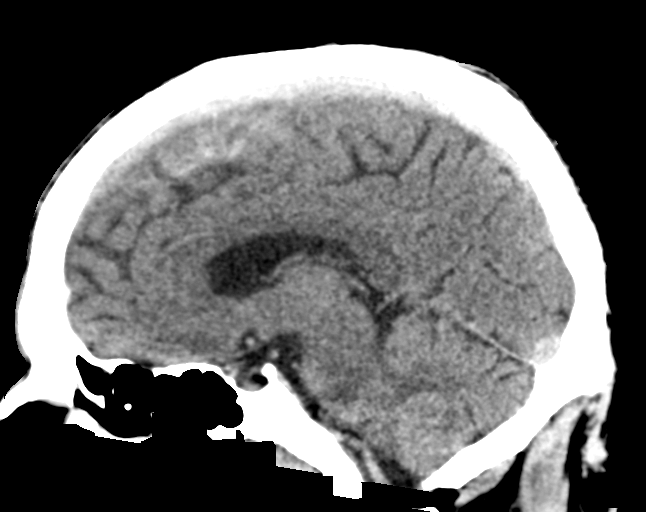
[im 39/58  brain]
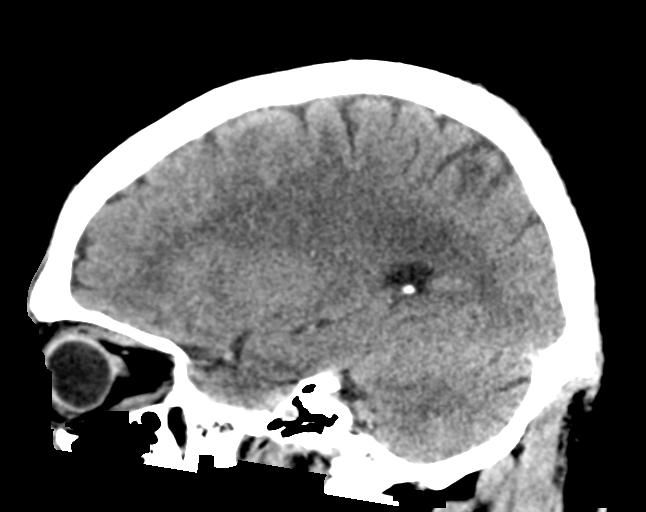

[16 of 47 positions shown; findings below may reference images not displayed]

FINDINGS: Brain: No hemorrhage or intracranial mass is visualized. Interval
mild patchy white matter hypodensity likely chronic small vessel
ischemic change. Possible acute or subacute infarct within the right
basal ganglia and white matter. Ventricles are nonenlarged.

Vascular: No hyperdense vessels.  Carotid vascular calcification

Skull: Normal. Negative for fracture or focal lesion.

Sinuses/Orbits: Mucosal thickening in the sinuses. Old fracture
medial wall left orbit

Other: None
IMPRESSION: 1. Possible acute to subacute lacunar infarct within the right basal
ganglia and white matter. Further evaluation with MRI may be
considered.
2. Negative for acute intracranial hemorrhage
3. Other patchy white matter hypodensity likely chronic small vessel
ischemic change

## 2022-12-16 ENCOUNTER — Encounter (HOSPITAL_COMMUNITY): Payer: Self-pay

## 2022-12-16 ENCOUNTER — Emergency Department (HOSPITAL_COMMUNITY)
Admission: EM | Admit: 2022-12-16 | Discharge: 2022-12-16 | Disposition: A | Discharge status: Home / Self Care | Payer: MEDICAID | Attending: Emergency Medicine | Admitting: Emergency Medicine

## 2022-12-16 ENCOUNTER — Emergency Department (HOSPITAL_COMMUNITY): Payer: MEDICAID

## 2022-12-16 ENCOUNTER — Other Ambulatory Visit: Payer: Self-pay

## 2022-12-16 DIAGNOSIS — N189 Chronic kidney disease, unspecified: Secondary | ICD-10-CM | POA: Insufficient documentation

## 2022-12-16 DIAGNOSIS — R7989 Other specified abnormal findings of blood chemistry: Secondary | ICD-10-CM | POA: Diagnosis not present

## 2022-12-16 DIAGNOSIS — Z7982 Long term (current) use of aspirin: Secondary | ICD-10-CM | POA: Insufficient documentation

## 2022-12-16 DIAGNOSIS — R1011 Right upper quadrant pain: Secondary | ICD-10-CM | POA: Insufficient documentation

## 2022-12-16 DIAGNOSIS — R109 Unspecified abdominal pain: Secondary | ICD-10-CM

## 2022-12-16 DIAGNOSIS — Z7902 Long term (current) use of antithrombotics/antiplatelets: Secondary | ICD-10-CM | POA: Insufficient documentation

## 2022-12-16 DIAGNOSIS — Z794 Long term (current) use of insulin: Secondary | ICD-10-CM | POA: Diagnosis not present

## 2022-12-16 LAB — CBC
HCT: 45.2 % (ref 39.0–52.0)
Hemoglobin: 15.2 g/dL (ref 13.0–17.0)
MCH: 29.5 pg (ref 26.0–34.0)
MCHC: 33.6 g/dL (ref 30.0–36.0)
MCV: 87.8 fL (ref 80.0–100.0)
Platelets: 170 10*3/uL (ref 150–400)
RBC: 5.15 MIL/uL (ref 4.22–5.81)
RDW: 12.7 % (ref 11.5–15.5)
WBC: 7.6 10*3/uL (ref 4.0–10.5)
nRBC: 0 % (ref 0.0–0.2)

## 2022-12-16 LAB — URINALYSIS, ROUTINE W REFLEX MICROSCOPIC
Bacteria, UA: NONE SEEN
Bilirubin Urine: NEGATIVE
Glucose, UA: >=500 mg/dL — AB
Ketones, ur: NEGATIVE mg/dL
Leukocytes,Ua: NEGATIVE
Nitrite: NEGATIVE
Protein, ur: 100 mg/dL — AB
Specific Gravity, Urine: 1.013 (ref 1.005–1.030)
pH: 5 (ref 5.0–8.0)

## 2022-12-16 LAB — COMPREHENSIVE METABOLIC PANEL
ALT: 12 U/L (ref 0–44)
AST: 18 U/L (ref 15–41)
Albumin: 3.7 g/dL (ref 3.5–5.0)
Alkaline Phosphatase: 64 U/L (ref 38–126)
Anion gap: 10 (ref 5–15)
BUN: 30 mg/dL — ABNORMAL HIGH (ref 8–23)
CO2: 17 mmol/L — ABNORMAL LOW (ref 22–32)
Calcium: 9.4 mg/dL (ref 8.9–10.3)
Chloride: 110 mmol/L (ref 98–111)
Creatinine, Ser: 2.27 mg/dL — ABNORMAL HIGH (ref 0.61–1.24)
GFR, Estimated: 32 mL/min — ABNORMAL LOW (ref >60)
Glucose, Bld: 212 mg/dL — ABNORMAL HIGH (ref 70–99)
Potassium: 4.3 mmol/L (ref 3.5–5.1)
Sodium: 137 mmol/L (ref 135–145)
Total Bilirubin: 0.5 mg/dL (ref 0.3–1.2)
Total Protein: 6.9 g/dL (ref 6.5–8.1)

## 2022-12-16 LAB — LIPASE, BLOOD: Lipase: 28 U/L (ref 11–51)

## 2022-12-16 MED ORDER — IOHEXOL 350 MG/ML SOLN
75.0000 mL | Freq: Once | INTRAVENOUS | Status: AC | PRN
  Administered 2022-12-16: 65 mL via INTRAVENOUS

## 2022-12-16 NOTE — ED Provider Notes (Signed)
[place] EMERGENCY DEPARTMENT AT [facility] Provider Note   CSN: [id number] Arrival date & time: 12/16/22  0347     History  Chief Complaint  Patient presents with   Abdominal Pain    Eduardo Weeks is a 62 y.o. male presenting to the emergency department with right upper quadrant abdominal pain.  Patient reports gradual onset of pain about 3 days ago.  He says he was doing exercises at the gym with expanding his chest muscles but does not recall any immediate pain at the time.  Pain was more insidious.  Is been present for 3 days.  Is worse with deep inspiration and movement.  He denies nausea, vomiting, constipation, is having loose stools.  Denies fevers, chills, productive coughing.  He is unaware of any history of abdominal surgery cannot recall the top of his mind whether he has had abdominal surgery, per my review of external records from [facility], CT of the abdomen pelvis in 2020, the patient did have a normal gallbladder present at the time.  HPI     Home Medications Prior to Admission medications   Medication Sig Start Date End Date Taking? Authorizing Provider  aspirin 81 MG chewable tablet Chew 1 tablet (81 mg total) by mouth daily. 11/06/20  Yes Marinda Elk, MD  carvedilol (COREG) 12.5 MG tablet Take 1 tablet (12.5 mg total) by mouth 2 (two) times daily with a meal. 11/05/20  Yes Marinda Elk, MD  clopidogrel (PLAVIX) 75 MG tablet Take 1 tablet (75 mg total) by mouth daily. 11/05/20  Yes Marinda Elk, MD  dapagliflozin propanediol (FARXIGA) 10 MG TABS tablet TAKE 1 TABLET BY MOUTH ONCE DAILY BEFORE BREAKFAST 07/21/22  Yes Corky Crafts, MD  gabapentin (NEURONTIN) 800 MG tablet Take 800 mg by mouth See admin instructions. Take up to 4 times a day as needed for neuropathy. 06/14/20  Yes [provider]  insulin aspart (NOVOLOG) 100 UNIT/ML injection Inject 4 Units into the skin 3 (three) times daily as needed for high  blood sugar.   Yes [provider]  insulin detemir (LEVEMIR) 100 UNIT/ML FlexPen Inject 45 Units into the skin daily at 6 (six) AM. 04/11/21  Yes [provider]  lisinopril (ZESTRIL) 10 MG tablet Take 10 mg by mouth daily. 03/20/22  Yes [provider]  metFORMIN (GLUCOPHAGE) 500 MG tablet Take 1 tablet (500 mg total) by mouth 2 (two) times daily with a meal. 11/12/20  Yes Orbie Pyo, MD  nitroGLYCERIN (NITROSTAT) 0.4 MG SL tablet Place 1 tablet (0.4 mg total) under the tongue every 5 (five) minutes as needed for chest pain. 11/11/20 12/16/22 Yes Orbie Pyo, MD  pantoprazole (PROTONIX) 40 MG tablet Take 40 mg by mouth daily.   Yes [provider]  rosuvastatin (CRESTOR) 40 MG tablet Take 1 tablet (40 mg total) by mouth daily. 01/24/21  Yes Dunn, Dayna N, PA-C  sodium bicarbonate 650 MG tablet Take 650 mg by mouth 2 (two) times daily.   Yes [provider]  tadalafil (CIALIS) 10 MG tablet Take 10 mg by mouth daily as needed for erectile dysfunction. 06/18/20  Yes [provider]  traZODone (DESYREL) 50 MG tablet Take 50 mg by mouth at bedtime as needed for sleep. 09/05/22  Yes [provider]  ACCU-CHEK GUIDE test strip USE TO MONITOR BLOOD SUGAR FIVE TIMES DAILY 11/08/20   [provider]      Allergies    Onion and Other  Review of Systems   Review of Systems  Physical Exam Updated Vital Signs BP (!) 139/99   Pulse 64   Temp 97.7 F (36.5 C) (Oral)   Resp 16   Ht 5\' 9"  (1.753 m)   Wt 78.9 kg   SpO2 98%   BMI 25.70 kg/m  Physical Exam Constitutional:      General: He is not in acute distress. HENT:     Head: Normocephalic and atraumatic.  Eyes:     Conjunctiva/sclera: Conjunctivae normal.     Pupils: Pupils are equal, round, and reactive to light.  Cardiovascular:     Rate and Rhythm: Normal rate and regular rhythm.  Pulmonary:     Effort: Pulmonary effort is normal. No respiratory distress.   Abdominal:     General: There is no distension.     Tenderness: There is abdominal tenderness in the right upper quadrant. There is no guarding or rebound.  Skin:    General: Skin is warm and dry.  Neurological:     General: No focal deficit present.     Mental Status: He is alert. Mental status is at baseline.  Psychiatric:        Mood and Affect: Mood normal.        Behavior: Behavior normal.     ED Results / Procedures / Treatments   Labs (all labs ordered are listed, but only abnormal results are displayed) Labs Reviewed  COMPREHENSIVE METABOLIC PANEL - Abnormal; Notable for the following components:      Result Value   CO2 17 (*)    Glucose, Bld 212 (*)    BUN 30 (*)    Creatinine, Ser 2.27 (*)    GFR, Estimated 32 (*)    All other components within normal limits  URINALYSIS, ROUTINE W REFLEX MICROSCOPIC - Abnormal; Notable for the following components:   Color, Urine STRAW (*)    Glucose, UA >=500 (*)    Hgb urine dipstick SMALL (*)    Protein, ur 100 (*)    All other components within normal limits  LIPASE, BLOOD  CBC    EKG None  Radiology CT ABDOMEN PELVIS W CONTRAST  Result Date: 12/16/2022 CLINICAL DATA:  Right upper quadrant abdominal pain. EXAM: CT ABDOMEN AND PELVIS WITH CONTRAST TECHNIQUE: Multidetector CT imaging of the abdomen and pelvis was performed using the standard protocol following bolus administration of intravenous contrast. RADIATION DOSE REDUCTION: This exam was performed according to the departmental dose-optimization program which includes automated exposure control, adjustment of the mA and/or kV according to patient size and/or use of iterative reconstruction technique. CONTRAST:  65mL OMNIPAQUE IOHEXOL 350 MG/ML SOLN COMPARISON:  None Available. FINDINGS: Lower chest: Age advanced coronary artery calcifications. No pericardial effusion. Bibasilar dependent atelectasis. No pleural effusions or pulmonary lesions. 5 mm right middle lobe  pulmonary nodule is unchanged since a prior CT scan from 2012 and considered benign. Hepatobiliary: No focal liver abnormality is seen. No gallstones, gallbladder wall thickening, or biliary dilatation. Pancreas: Unremarkable. No pancreatic ductal dilatation or surrounding inflammatory changes. Spleen: Normal in size without focal abnormality. Adrenals/Urinary Tract: Adrenal glands are unremarkable. Very small lower pole right renal calculus but no ureteral calculi. No renal lesions hydronephrosis. Bladder is mildly distended. No bladder lesions or thickening. Stomach/Bowel: The stomach, duodenum, small bowel and colon are grossly normal without oral contrast. No inflammatory changes, mass lesions or obstructive findings. The appendix is normal. Vascular/Lymphatic: The aorta is normal in caliber. No dissection. The branch vessels are  patent. The major venous structures are patent. No mesenteric or retroperitoneal mass or adenopathy. Small scattered lymph nodes are noted. Reproductive: No significant findings. Other: No pelvic mass or adenopathy. No free pelvic fluid collections. No inguinal mass or adenopathy. No abdominal wall hernia or subcutaneous lesions. Musculoskeletal: Evidence of prior pelvic trauma with healed fractures and pubic symphysis fixation hardware. 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 No acute bony findings. 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 IMPRESSION: 1. No acute abdominal/pelvic findings, mass lesions or adenopathy. 2. Age advanced coronary artery calcifications. 3. Evidence of prior pelvic trauma with healed fractures and pubic symphysis fixation hardware. 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 Electronically Signed   By: Rudie Meyer M.D.   On: 12/16/2022 11:22   US Abdomen Limited RUQ (LIVER/GB)  Result Date:  12/16/2022 CLINICAL DATA:  Abdominal pain EXAM: ULTRASOUND ABDOMEN LIMITED RIGHT UPPER QUADRANT COMPARISON:  03/18/2013 FINDINGS: Gallbladder: No gallstones or wall thickening visualized. No sonographic Murphy sign noted by sonographer. Common bile duct: Diameter: 3 mm Liver: No focal lesion identified. Within normal limits in parenchymal echogenicity. Portal vein is patent on color Doppler imaging with normal direction of blood flow towards the liver. Other: None. IMPRESSION: No findings to explain the patient's history of pain. Electronically Signed   By: Kennith Center M.D.   On: 12/16/2022 08:01   DG Chest 2 View  Result Date: 12/16/2022 CLINICAL DATA:  Right lower chest pain. EXAM: CHEST - 2 VIEW COMPARISON:  11/02/2020 FINDINGS: The lungs are clear without focal pneumonia, edema, pneumothorax or pleural effusion. The cardiopericardial silhouette is within normal limits for size. No acute bony abnormality. IMPRESSION: No active cardiopulmonary disease. Electronically Signed   By: Kennith Center M.D.   On: 12/16/2022 07:50    Procedures Procedures    Medications Ordered in ED Medications  iohexol (OMNIPAQUE) 350 MG/ML injection 75 mL (65 mLs Intravenous Contrast Given 12/16/22 1100)    ED Course/ Medical Decision Making/ A&P                                 Medical Decision Making Amount and/or Complexity of Data Reviewed Labs: ordered. Radiology: ordered.  Risk Prescription drug management.   This patient presents to the ED with concern for right upper quadrant abdominal pain. This involves an extensive number of treatment options, and is a complaint that carries with it a high risk of complications and morbidity.  The differential diagnosis includes acute biliary disease versus pancreatitis versus peptic ulcer versus colitis versus kidney disease versus right-sided lower lobe pneumonia versus musculoskeletal pain versus other  External records from outside source obtained and reviewed  including CT from [facility] in 2020  I ordered and personally interpreted labs.  The pertinent results include: No emergent findings.  Chronic kidney disease with creatinine near baseline level.  UA without evidence of infection.  I ordered imaging studies including right upper quadrant ultrasound, x-ray of the chest, CT abdomen pelvis I independently visualized and interpreted imaging which showed stable pulmonary nodule, no emergent findings noted otherwise.  Normal gallbladder I agree with the radiologist interpretation  I have reviewed the patients home medicines and have made adjustments as needed  Test Considered: Doubt testicular torsion or mesenteric ischemia.  No indication for testicular ultrasound or CT angiogram at this time.  Doubt AAA.  Doubt PE.  After the interventions noted above, I reevaluated the patient and found that they have: stayed the same   Clinically I suspect this is most likely a muscle wall strain or injury, perhaps from exercise.  I do not see any more dangerous process happening at this time.  I think the patient is stable for discharge.  Blood pressure did improve with rest overnight.  Patient has muscle relaxers prescribed a PCP at home and he can try these medications as needed.  Dispostion:  After consideration of the diagnostic results and the patients response to treatment, I feel that the patent would benefit from outpatient follow-up.         Final Clinical Impression(s) / ED Diagnoses Final diagnoses:  Abdominal wall pain    Rx / DC Orders ED Discharge Orders     None         Terald Sleeper, MD 12/16/22 1150

## 2022-12-16 NOTE — Discharge Instructions (Signed)
Your CT scan, gallbladder ultrasound, blood test and chest x-ray did not show any emergency cause of your abdominal pain.  You may have an injury to the wall of your abdomen, or an injury or strain of the muscles in the abdomen.  You can continue using muscle relaxers or heating packs or over-the-counter muscle rubs on the spot as needed for the next 1 to 2 weeks.  Follow-up with your doctor in the office if you are having persistent pain.

## 2022-12-16 NOTE — ED Triage Notes (Signed)
Pt c/o RUQ abdominal pain that started Wednesday night. Pain has been constant, worsening with movement, described as a "deep ache." No NVD. Triage BP 175/108, report hx HTN and compliance with medications

## 2022-12-16 NOTE — ED Notes (Signed)
Patient transported to X-ray 

## 2023-03-28 ENCOUNTER — Telehealth: Payer: Self-pay

## 2023-03-28 ENCOUNTER — Other Ambulatory Visit (HOSPITAL_COMMUNITY): Payer: Self-pay

## 2023-03-28 NOTE — Telephone Encounter (Signed)
Pharmacy Patient Advocate Encounter   Received notification from CoverMyMeds that prior authorization for [facility] is required/requested.   Insurance verification completed.   The patient is insured through [facility] .   Per test claim: PA required; PA submitted to above mentioned insurance via CoverMyMeds Key/confirmation #/[id number] [facility] Status is pending

## 2023-03-29 ENCOUNTER — Other Ambulatory Visit (HOSPITAL_COMMUNITY): Payer: Self-pay

## 2023-03-29 NOTE — Telephone Encounter (Signed)
Pharmacy Patient Advocate Encounter  Received notification from [facility] that Prior Authorization for Marcelline Deist has been APPROVED from 03/28/23 to 03/27/24

## 2023-05-06 ENCOUNTER — Emergency Department (HOSPITAL_COMMUNITY): Payer: MEDICAID

## 2023-05-06 ENCOUNTER — Other Ambulatory Visit: Payer: Self-pay

## 2023-05-06 ENCOUNTER — Emergency Department (HOSPITAL_COMMUNITY)
Admission: EM | Admit: 2023-05-06 | Discharge: 2023-05-06 | Disposition: A | Discharge status: Home / Self Care | Payer: MEDICAID | Attending: Emergency Medicine | Admitting: Emergency Medicine

## 2023-05-06 ENCOUNTER — Encounter (HOSPITAL_COMMUNITY): Payer: Self-pay | Admitting: Emergency Medicine

## 2023-05-06 DIAGNOSIS — N183 Chronic kidney disease, stage 3 unspecified: Secondary | ICD-10-CM | POA: Insufficient documentation

## 2023-05-06 DIAGNOSIS — I2510 Atherosclerotic heart disease of native coronary artery without angina pectoris: Secondary | ICD-10-CM | POA: Diagnosis not present

## 2023-05-06 DIAGNOSIS — F1490 Cocaine use, unspecified, uncomplicated: Secondary | ICD-10-CM | POA: Diagnosis not present

## 2023-05-06 DIAGNOSIS — Z79899 Other long term (current) drug therapy: Secondary | ICD-10-CM | POA: Diagnosis not present

## 2023-05-06 DIAGNOSIS — N201 Calculus of ureter: Secondary | ICD-10-CM

## 2023-05-06 DIAGNOSIS — N132 Hydronephrosis with renal and ureteral calculous obstruction: Secondary | ICD-10-CM | POA: Insufficient documentation

## 2023-05-06 DIAGNOSIS — R109 Unspecified abdominal pain: Secondary | ICD-10-CM | POA: Diagnosis present

## 2023-05-06 DIAGNOSIS — Z7982 Long term (current) use of aspirin: Secondary | ICD-10-CM | POA: Insufficient documentation

## 2023-05-06 DIAGNOSIS — Z794 Long term (current) use of insulin: Secondary | ICD-10-CM | POA: Diagnosis not present

## 2023-05-06 DIAGNOSIS — Z7901 Long term (current) use of anticoagulants: Secondary | ICD-10-CM | POA: Diagnosis not present

## 2023-05-06 DIAGNOSIS — E1122 Type 2 diabetes mellitus with diabetic chronic kidney disease: Secondary | ICD-10-CM | POA: Insufficient documentation

## 2023-05-06 DIAGNOSIS — I129 Hypertensive chronic kidney disease with stage 1 through stage 4 chronic kidney disease, or unspecified chronic kidney disease: Secondary | ICD-10-CM | POA: Insufficient documentation

## 2023-05-06 LAB — CBC
HCT: 47.1 % (ref 39.0–52.0)
Hemoglobin: 15.7 g/dL (ref 13.0–17.0)
MCH: 30.3 pg (ref 26.0–34.0)
MCHC: 33.3 g/dL (ref 30.0–36.0)
MCV: 90.8 fL (ref 80.0–100.0)
Platelets: 163 10*3/uL (ref 150–400)
RBC: 5.19 MIL/uL (ref 4.22–5.81)
RDW: 12.7 % (ref 11.5–15.5)
WBC: 8.1 10*3/uL (ref 4.0–10.5)
nRBC: 0 % (ref 0.0–0.2)

## 2023-05-06 LAB — BASIC METABOLIC PANEL
Anion gap: 10 (ref 5–15)
BUN: 30 mg/dL — ABNORMAL HIGH (ref 8–23)
CO2: 22 mmol/L (ref 22–32)
Calcium: 9.8 mg/dL (ref 8.9–10.3)
Chloride: 105 mmol/L (ref 98–111)
Creatinine, Ser: 3.09 mg/dL — ABNORMAL HIGH (ref 0.61–1.24)
GFR, Estimated: 22 mL/min — ABNORMAL LOW (ref >60)
Glucose, Bld: 149 mg/dL — ABNORMAL HIGH (ref 70–99)
Potassium: 4.8 mmol/L (ref 3.5–5.1)
Sodium: 137 mmol/L (ref 135–145)

## 2023-05-06 LAB — URINALYSIS, MICROSCOPIC (REFLEX): WBC, UA: NONE SEEN WBC/hpf (ref 0–5)

## 2023-05-06 LAB — URINALYSIS, ROUTINE W REFLEX MICROSCOPIC
Bilirubin Urine: NEGATIVE
Glucose, UA: >=500 mg/dL — AB
Ketones, ur: NEGATIVE mg/dL
Leukocytes,Ua: NEGATIVE
Nitrite: NEGATIVE
Protein, ur: 30 mg/dL — AB
Specific Gravity, Urine: 1.015 (ref 1.005–1.030)
pH: 6 (ref 5.0–8.0)

## 2023-05-06 MED ORDER — TAMSULOSIN HCL 0.4 MG PO CAPS: 0.4000 mg | ORAL_CAPSULE | Freq: Every day | ORAL | 0 refills | Status: DC

## 2023-05-06 MED ORDER — OXYCODONE HCL 5 MG PO TABS: 5.0000 mg | ORAL_TABLET | Freq: Four times a day (QID) | ORAL | 0 refills | Status: DC | PRN

## 2023-05-06 NOTE — ED Provider Notes (Signed)
 [place] EMERGENCY DEPARTMENT AT [facility] Provider Note   CSN: [id number] Arrival date & time: 05/06/23  1420     History  Chief Complaint  Patient presents with   Flank Pain    Eduardo Weeks is a 63 y.o. male with history of alcohol use, CAD s/p angioplasty with stent placement 2022, CKD stage III, cocaine use, diabetes, hepatitis C, CVA, hypertension, mitral regurg, who presents to the emergency department complaining of right sided flank pain.  Patient states symptoms started last night around 9 PM.  He went to urgent care earlier today and was given an injection of Toradol.  He states his pain is almost entirely resolved since then.  He did have 1 episode of vomiting prior to going to urgent care.  He was sent to the ER for further evaluation.   Flank Pain Associated symptoms include abdominal pain.       Home Medications Prior to Admission medications   Medication Sig Start Date End Date Taking? Authorizing Provider  oxyCODONE (ROXICODONE) 5 MG immediate release tablet Take 1 tablet (5 mg total) by mouth every 6 (six) hours as needed for breakthrough pain. 05/06/23  Yes Yuepheng Schaller T, PA-C  tamsulosin (FLOMAX) 0.4 MG CAPS capsule Take 1 capsule (0.4 mg total) by mouth daily. 05/06/23  Yes Chauncy Mangiaracina T, PA-C  ACCU-CHEK GUIDE test strip USE TO MONITOR BLOOD SUGAR FIVE TIMES DAILY 11/08/20   [provider]  aspirin 81 MG chewable tablet Chew 1 tablet (81 mg total) by mouth daily. 11/06/20   Marinda Elk, MD  carvedilol (COREG) 12.5 MG tablet Take 1 tablet (12.5 mg total) by mouth 2 (two) times daily with a meal. 11/05/20   Marinda Elk, MD  clopidogrel (PLAVIX) 75 MG tablet Take 1 tablet (75 mg total) by mouth daily. 11/05/20   Marinda Elk, MD  dapagliflozin propanediol (FARXIGA) 10 MG TABS tablet TAKE 1 TABLET BY MOUTH ONCE DAILY BEFORE BREAKFAST 07/21/22   Corky Crafts, MD  gabapentin (NEURONTIN) 800 MG tablet  Take 800 mg by mouth See admin instructions. Take up to 4 times a day as needed for neuropathy. 06/14/20   [provider]  insulin aspart (NOVOLOG) 100 UNIT/ML injection Inject 4 Units into the skin 3 (three) times daily as needed for high blood sugar.    [provider]  insulin detemir (LEVEMIR) 100 UNIT/ML FlexPen Inject 45 Units into the skin daily at 6 (six) AM. 04/11/21   [provider]  lisinopril (ZESTRIL) 10 MG tablet Take 10 mg by mouth daily. 03/20/22   [provider]  metFORMIN (GLUCOPHAGE) 500 MG tablet Take 1 tablet (500 mg total) by mouth 2 (two) times daily with a meal. 11/12/20   Orbie Pyo, MD  nitroGLYCERIN (NITROSTAT) 0.4 MG SL tablet Place 1 tablet (0.4 mg total) under the tongue every 5 (five) minutes as needed for chest pain. 11/11/20 12/16/22  Orbie Pyo, MD  pantoprazole (PROTONIX) 40 MG tablet Take 40 mg by mouth daily.    [provider]  rosuvastatin (CRESTOR) 40 MG tablet Take 1 tablet (40 mg total) by mouth daily. 01/24/21   Dunn, Tacey Ruiz, PA-C  sodium bicarbonate 650 MG tablet Take 650 mg by mouth 2 (two) times daily.    [provider]  tadalafil (CIALIS) 10 MG tablet Take 10 mg by mouth daily as needed for erectile dysfunction. 06/18/20   [provider]  traZODone (DESYREL) 50 MG tablet Take 50  mg by mouth at bedtime as needed for sleep. 09/05/22   [provider]      Allergies    Onion and Other    Review of Systems   Review of Systems  Gastrointestinal:  Positive for abdominal pain, nausea and vomiting.  Genitourinary:  Positive for flank pain.  All other systems reviewed and are negative.   Physical Exam Updated Vital Signs BP 110/89   Pulse 79   Temp 97.6 F (36.4 C) (Oral)   Resp 16   SpO2 98%  Physical Exam Vitals and nursing note reviewed.  Constitutional:      Appearance: Normal appearance.  HENT:     Head: Normocephalic and atraumatic.  Eyes:      Conjunctiva/sclera: Conjunctivae normal.  Cardiovascular:     Rate and Rhythm: Normal rate and regular rhythm.  Pulmonary:     Effort: Pulmonary effort is normal. No respiratory distress.     Breath sounds: Normal breath sounds.  Abdominal:     General: There is no distension.     Palpations: Abdomen is soft.     Tenderness: There is no abdominal tenderness.       Comments: Area of pain, not TTP  Skin:    General: Skin is warm and dry.  Neurological:     General: No focal deficit present.     Mental Status: He is alert.     ED Results / Procedures / Treatments   Labs (all labs ordered are listed, but only abnormal results are displayed) Labs Reviewed  URINALYSIS, ROUTINE W REFLEX MICROSCOPIC - Abnormal; Notable for the following components:      Result Value   Glucose, UA >=500 (*)    Hgb urine dipstick SMALL (*)    Protein, ur 30 (*)    All other components within normal limits  BASIC METABOLIC PANEL - Abnormal; Notable for the following components:   Glucose, Bld 149 (*)    BUN 30 (*)    Creatinine, Ser 3.09 (*)    GFR, Estimated 22 (*)    All other components within normal limits  URINALYSIS, MICROSCOPIC (REFLEX) - Abnormal; Notable for the following components:   Bacteria, UA RARE (*)    All other components within normal limits  CBC    EKG None  Radiology CT Renal Stone Study Result Date: 05/06/2023 CLINICAL DATA:  Vomiting, abdominal/flank pain. EXAM: CT ABDOMEN AND PELVIS WITHOUT CONTRAST TECHNIQUE: Multidetector CT imaging of the abdomen and pelvis was performed following the standard protocol without IV contrast. RADIATION DOSE REDUCTION: This exam was performed according to the departmental dose-optimization program which includes automated exposure control, adjustment of the mA and/or kV according to patient size and/or use of iterative reconstruction technique. COMPARISON:  12/16/2022. FINDINGS: Lower chest: Mild dependent ground-glass in the lower lobes.  Age advanced three-vessel coronary artery calcification. Heart is at the upper limits of normal in size. No pericardial or pleural effusion. Distal esophagus is grossly unremarkable. Hepatobiliary: Liver and gallbladder are unremarkable. No biliary ductal dilatation. Pancreas: Negative. Spleen: Negative. Adrenals/Urinary Tract: Adrenal glands are unremarkable. Right renal edema. Mild right hydronephrosis with periureteral stranding secondary to a 2 mm stone at the right ureterovesical junction (3/80). No additional urinary stones. Kidneys are otherwise unremarkable. Left ureter is decompressed. Bladder is somewhat low in volume in thick-walled. Stomach/Bowel: Stomach, small bowel, appendix and colon are unremarkable. Vascular/Lymphatic: Atherosclerotic calcification of the aorta. No pathologically enlarged lymph nodes. Reproductive: Prostate may be minimally prominent. Other: No free fluid.  Mesenteries and peritoneum are unremarkable. Musculoskeletal: Symphysis pubis hardware fixation. Old left iliac wing fracture. Heterotopic calcification along the proximal left femur. Left sacroiliac joint appears partially fused. Degenerative changes in the spine. IMPRESSION: 1. Mild right hydronephrosis with right renal and right periureteric edema secondary to a 2 mm stone at the right ureteral vesicle junction. 2. Age advanced three-vessel coronary artery calcification. 3. Prostate may be minimally prominent. Bladder wall thickening may be due to an element of outlet obstruction. 4.  Aortic atherosclerosis (ICD10-I70.0). Electronically Signed   By: Leanna Battles M.D.   On: 05/06/2023 16:42    Procedures Procedures    Medications Ordered in ED Medications - No data to display  ED Course/ Medical Decision Making/ A&P                                 Medical Decision Making Amount and/or Complexity of Data Reviewed Labs: ordered. Radiology: ordered.  This patient is a 63 y.o. male  who presents to the ED for  concern of right sided abdominal/flank pain.   Differential diagnoses prior to evaluation: The emergent differential diagnosis includes, but is not limited to,  AAA, renal vascular thrombosis, mesenteric ischemia, pyelonephritis, nephrolithiasis, cystitis, biliary colic, pancreatitis, PUD, appendicitis, diverticulitis, bowel obstruction, testicular torsion, Epididymitis. This is not an exhaustive differential.   Past Medical History / Co-morbidities / Social History: alcohol use, CAD s/p angioplasty with stent placement 2022, CKD stage III, cocaine use, diabetes, hepatitis C, CVA, hypertension, mitral regurg  Additional history: Chart reviewed. Pertinent results include: Reviewed UC visit note at bedside, pt received IM toradol 30 mg  Reviewed prior labs, most recent kidney function tests from November 2024, BUN 30, creatinine 2.27, GFR 32.  Physical Exam: Physical exam performed. The pertinent findings include: Normal vital signs, no acute distress.  Patient localizes abdominal discomfort to his right lateral abdomen, nontender to palpation.  Lab Tests/Imaging studies: I personally interpreted labs/imaging and the pertinent results include: CBC normal.  BMP with BUN 30, creatinine 3.09, GFR 22.  UA with over 500 glucose, small hemoglobin, rare bacteria.  CT with right UVJ stone, 2 mm in size. Mild right hydronpehrosis and surrounding edema. I agree with the radiologist interpretation.  Disposition: After consideration of the diagnostic results and the patients response to treatment, I feel that emergency department workup does not suggest an emergent condition requiring admission or immediate intervention beyond what has been performed at this time. The plan is: discharge to home. Will send with flomax, recommend tylenol PRN for pain (as pt is not supposed to take ibuprofen due to kidney disease) and give short course of roxicodone for break through pain. Recommend follow up with urology,  provided with follow up information. The patient is safe for discharge and has been instructed to return immediately for worsening symptoms, change in symptoms or any other concerns.  I discussed this case with my attending physician Dr. Jacqulyn Bath who cosigned this note including patient's presenting symptoms, physical exam, and planned diagnostics and interventions. Attending physician stated agreement with plan or made changes to plan which were implemented.   Final Clinical Impression(s) / ED Diagnoses Final diagnoses:  Calculus of ureterovesical junction (UVJ)  Right flank pain    Rx / DC Orders ED Discharge Orders          Ordered    tamsulosin (FLOMAX) 0.4 MG CAPS capsule  Daily        05/06/23  1700    oxyCODONE (ROXICODONE) 5 MG immediate release tablet  Every 6 hours PRN        05/06/23 1700           Portions of this report may have been transcribed using voice recognition software. Every effort was made to ensure accuracy; however, inadvertent computerized transcription errors may be present.    Jeanella Flattery 05/06/23 1702    Maia Plan, MD 05/09/23 1356

## 2023-05-06 NOTE — ED Triage Notes (Addendum)
 Pt came in after being [place] at an [place] for right flank/back pain.  Pain began around 9pm last night.  Pt wasn't able to sleep because he couldn't get comfortable. Pt endorses one episode of vomiting around 9am. Per paperwork, pt received 30 mg Toradol at the UC.

## 2023-05-06 NOTE — Discharge Instructions (Addendum)
 You were seen in the ER for flank pain.  Your scan showed a 2 mm kidney stone on the right side. I suspect this could have caused the symptoms you're having. It also caused a decrease in your kidney function. Continue to hydrate well, take the medication I have prescribed you to help you pass the stone, and you can take 1000 mg of tylenol

## 2023-07-07 ENCOUNTER — Other Ambulatory Visit: Payer: Self-pay | Admitting: Interventional Cardiology

## 2023-07-09 IMAGING — US US RENAL
1 series · 14 of 25 positions shown · non-contrast
Comparison: None.

CLINICAL DATA: Stage III B chronic kidney disease.

EXAM:
RENAL / URINARY TRACT ULTRASOUND COMPLETE

[Series 1: us renal · 0.20mm/px · 14 of 37 slices shown]
[im 1/37]
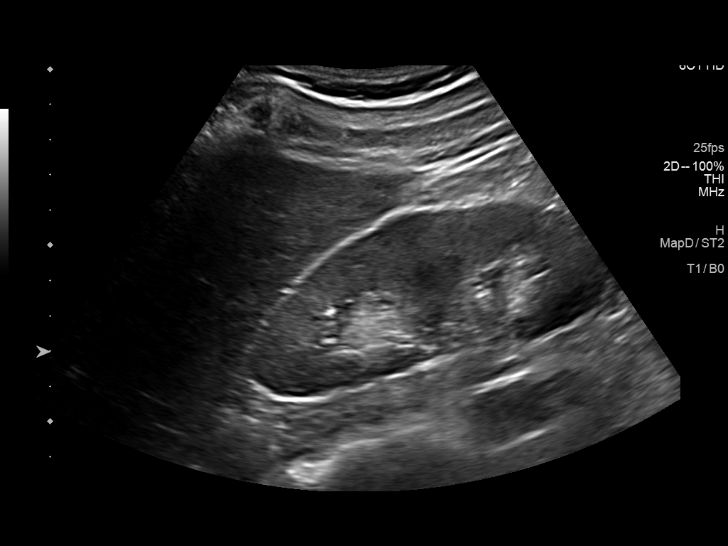
[im 4/37]
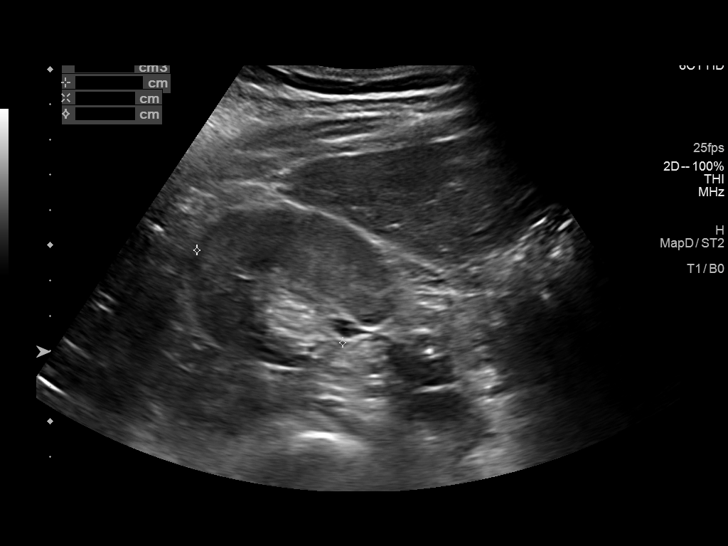
[im 7/37]
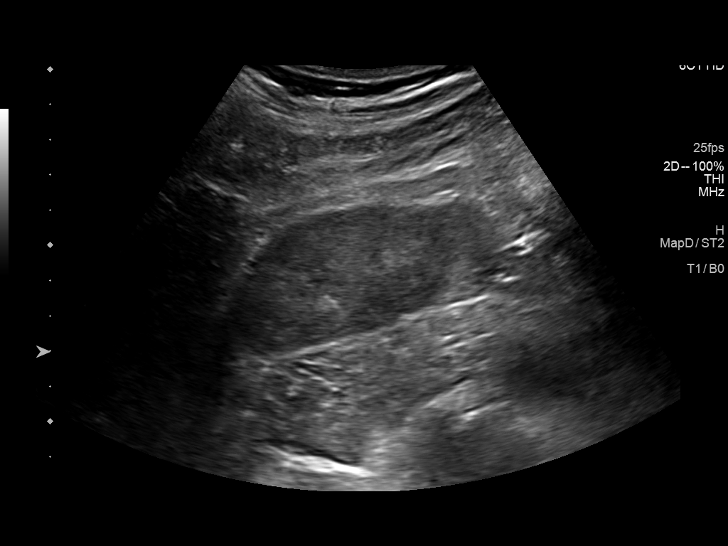
[im 10/37]
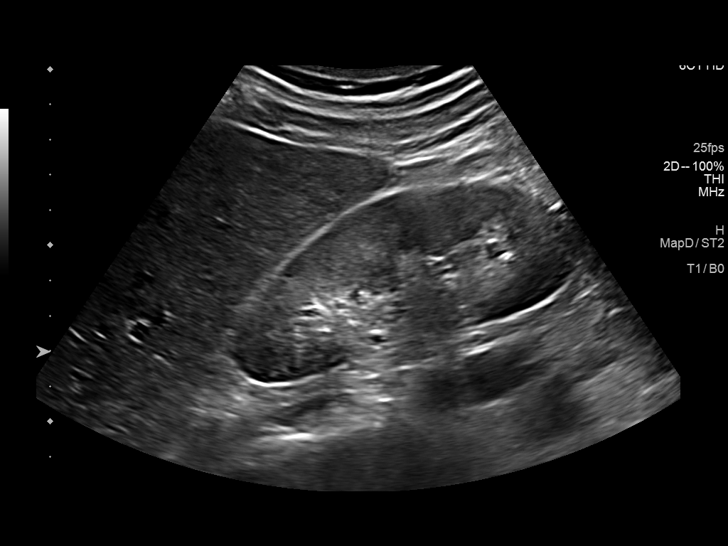
[im 13/37]
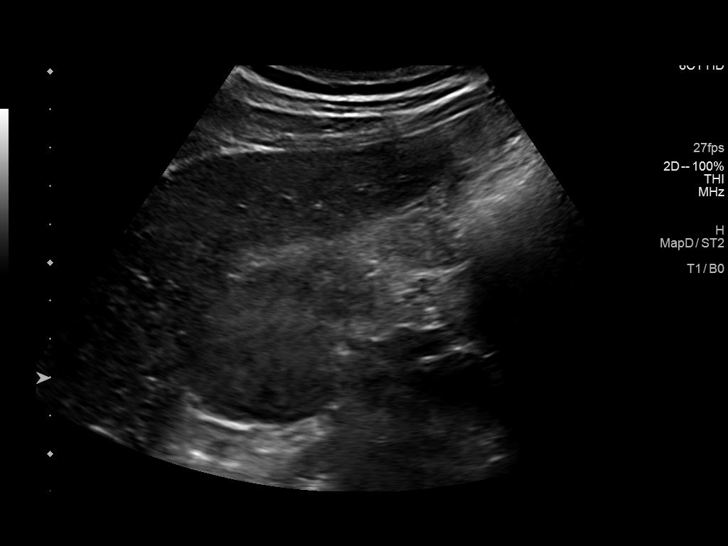
[im 14/37]
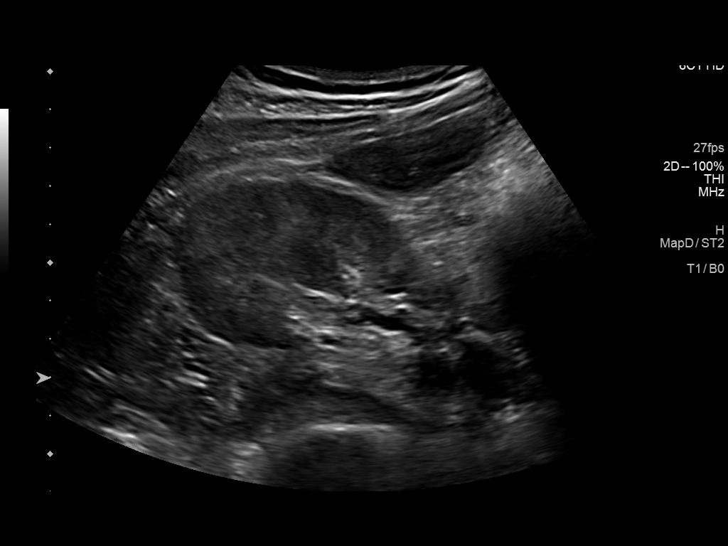
[im 17/37]
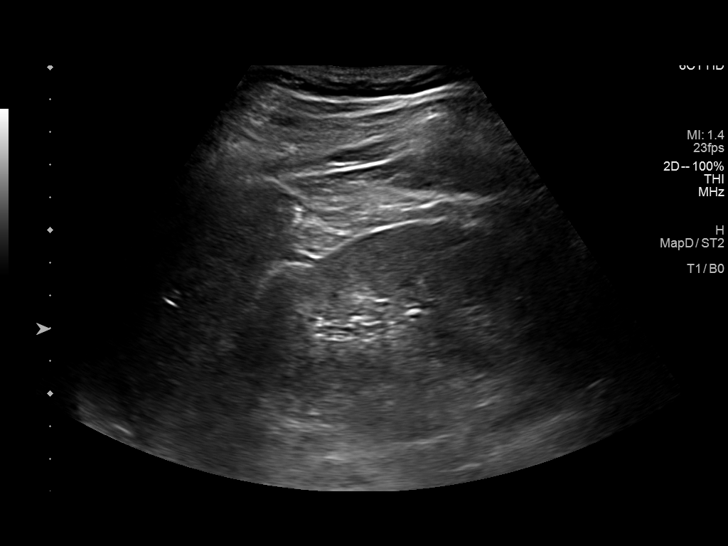
[im 20/37]
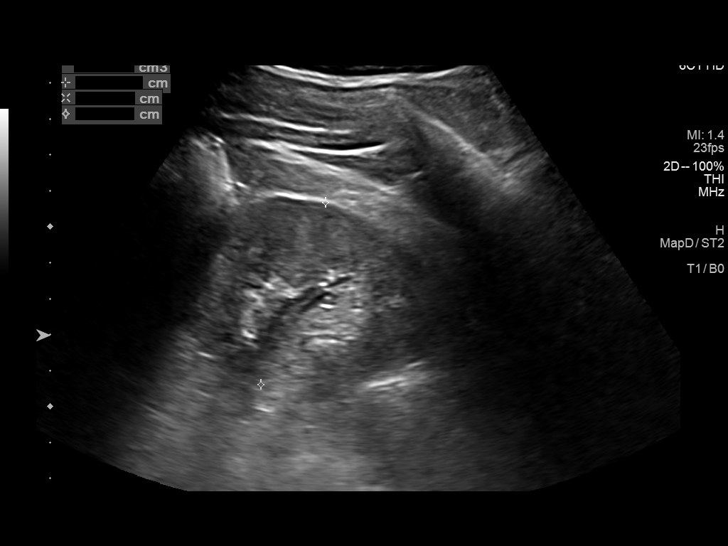
[im 23/37]
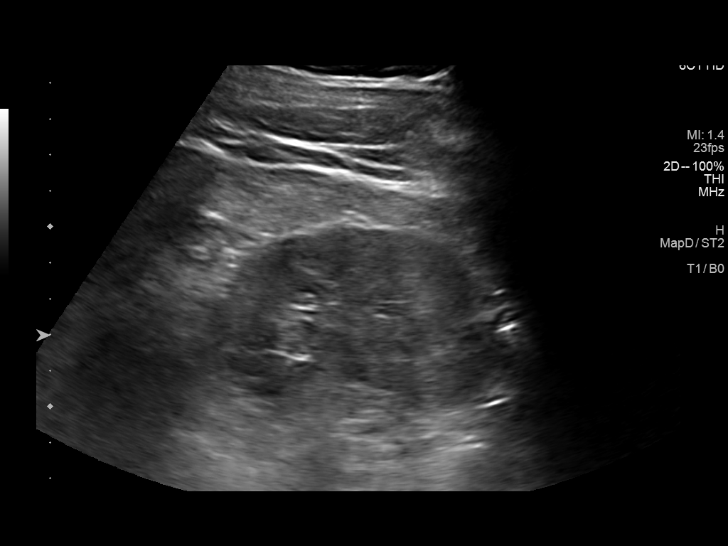
[im 25/37]
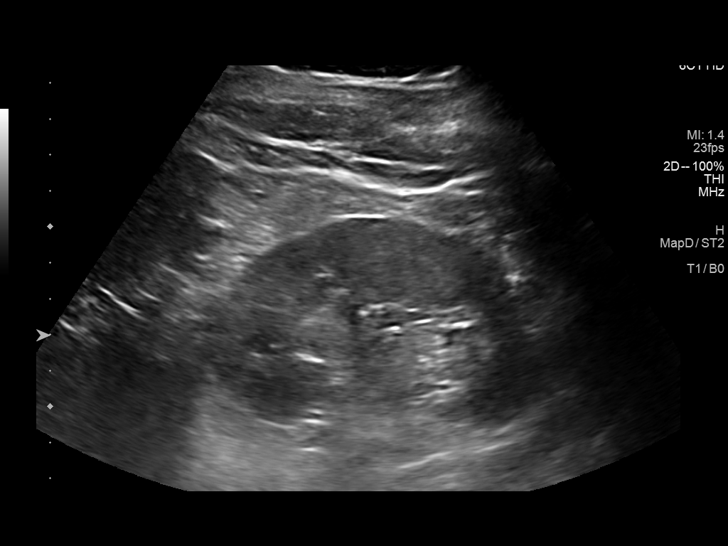
[im 28/37]
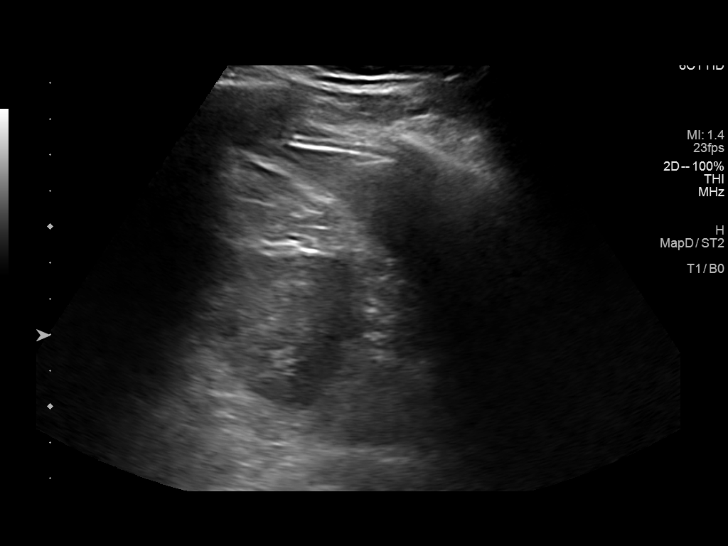
[im 31/37]
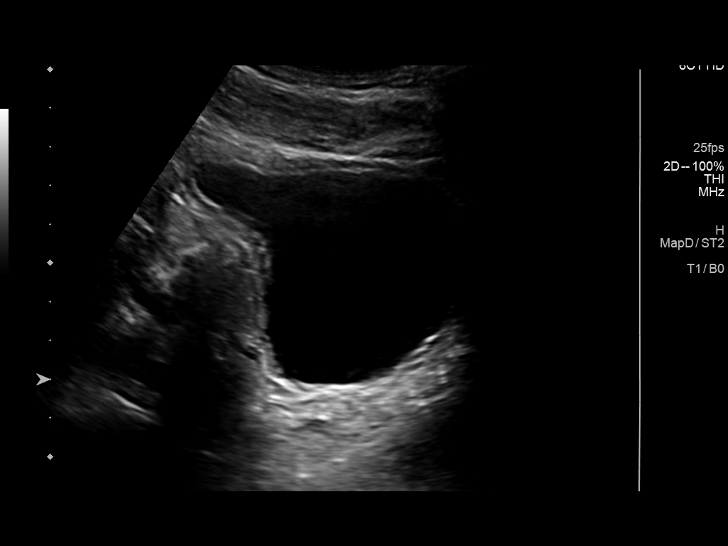
[im 34/37]
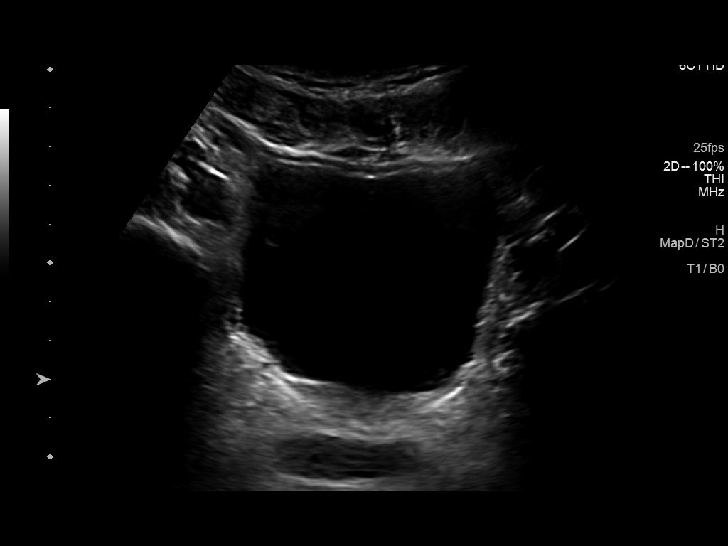
[im 37/37]
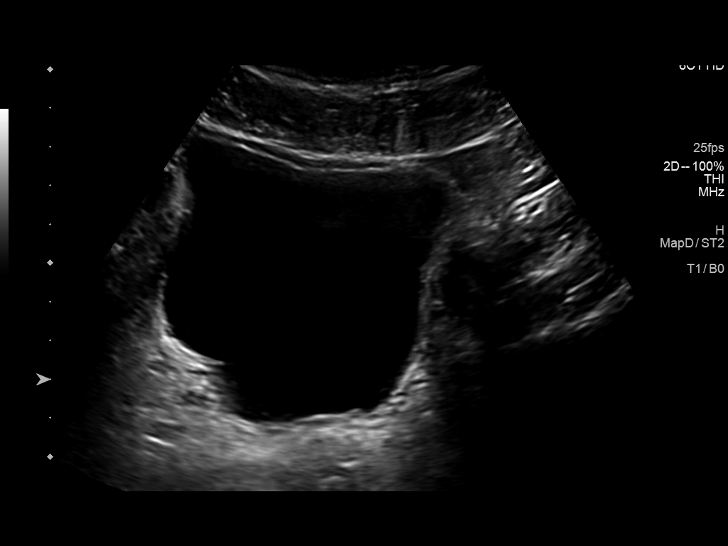

[14 of 25 positions shown; findings below may reference images not displayed]

FINDINGS: Right Kidney:

Renal measurements: 10.2 x 4.0 x 4.9 cm = volume: 10.5 mL.
Echogenicity is mildly increased. No mass or hydronephrosis
visualized.

Left Kidney:

Renal measurements: 10.2 x 6.0 x 5.4 cm = volume: 170 mL.
Echogenicity is mildly increased. No mass or hydronephrosis
visualized.

Bladder:

Appears normal for degree of bladder distention.

Other:

None.
IMPRESSION: 1. No hydronephrosis.
2. Mildly echogenic kidneys likely related to medical renal disease.

## 2023-07-27 ENCOUNTER — Observation Stay (HOSPITAL_COMMUNITY)
Admission: EM | Admit: 2023-07-27 | Discharge: 2023-07-28 | Disposition: A | Discharge status: Home / Self Care | Payer: MEDICAID | Attending: Family Medicine | Admitting: Family Medicine

## 2023-07-27 ENCOUNTER — Encounter (HOSPITAL_COMMUNITY): Payer: Self-pay

## 2023-07-27 ENCOUNTER — Emergency Department (HOSPITAL_COMMUNITY): Payer: MEDICAID

## 2023-07-27 ENCOUNTER — Other Ambulatory Visit: Payer: Self-pay

## 2023-07-27 DIAGNOSIS — Z955 Presence of coronary angioplasty implant and graft: Secondary | ICD-10-CM | POA: Diagnosis not present

## 2023-07-27 DIAGNOSIS — Z794 Long term (current) use of insulin: Secondary | ICD-10-CM | POA: Insufficient documentation

## 2023-07-27 DIAGNOSIS — E1122 Type 2 diabetes mellitus with diabetic chronic kidney disease: Secondary | ICD-10-CM | POA: Diagnosis not present

## 2023-07-27 DIAGNOSIS — I2510 Atherosclerotic heart disease of native coronary artery without angina pectoris: Secondary | ICD-10-CM | POA: Diagnosis not present

## 2023-07-27 DIAGNOSIS — N1832 Chronic kidney disease, stage 3b: Secondary | ICD-10-CM | POA: Diagnosis not present

## 2023-07-27 DIAGNOSIS — Z8673 Personal history of transient ischemic attack (TIA), and cerebral infarction without residual deficits: Secondary | ICD-10-CM | POA: Insufficient documentation

## 2023-07-27 DIAGNOSIS — M7918 Myalgia, other site: Secondary | ICD-10-CM | POA: Diagnosis present

## 2023-07-27 DIAGNOSIS — N400 Enlarged prostate without lower urinary tract symptoms: Secondary | ICD-10-CM | POA: Diagnosis not present

## 2023-07-27 DIAGNOSIS — N200 Calculus of kidney: Secondary | ICD-10-CM | POA: Diagnosis not present

## 2023-07-27 DIAGNOSIS — I129 Hypertensive chronic kidney disease with stage 1 through stage 4 chronic kidney disease, or unspecified chronic kidney disease: Secondary | ICD-10-CM | POA: Insufficient documentation

## 2023-07-27 DIAGNOSIS — N179 Acute kidney failure, unspecified: Principal | ICD-10-CM | POA: Diagnosis present

## 2023-07-27 LAB — URINALYSIS, ROUTINE W REFLEX MICROSCOPIC
Bacteria, UA: NONE SEEN
Bilirubin Urine: NEGATIVE
Glucose, UA: >=500 mg/dL — AB
Hgb urine dipstick: NEGATIVE
Ketones, ur: NEGATIVE mg/dL
Leukocytes,Ua: NEGATIVE
Nitrite: NEGATIVE
Protein, ur: 100 mg/dL — AB
Specific Gravity, Urine: 1.012 (ref 1.005–1.030)
pH: 5 (ref 5.0–8.0)

## 2023-07-27 LAB — I-STAT CG4 LACTIC ACID, ED: Lactic Acid, Venous: 1.1 mmol/L (ref 0.5–1.9)

## 2023-07-27 LAB — CBC WITH DIFFERENTIAL/PLATELET
Abs Immature Granulocytes: 0.04 10*3/uL (ref 0.00–0.07)
Basophils Absolute: 0 10*3/uL (ref 0.0–0.1)
Basophils Relative: 0 %
Eosinophils Absolute: 0.1 10*3/uL (ref 0.0–0.5)
Eosinophils Relative: 1 %
HCT: 52.4 % — ABNORMAL HIGH (ref 39.0–52.0)
Hemoglobin: 17.9 g/dL — ABNORMAL HIGH (ref 13.0–17.0)
Immature Granulocytes: 1 %
Lymphocytes Relative: 23 %
Lymphs Abs: 1.7 10*3/uL (ref 0.7–4.0)
MCH: 30.1 pg (ref 26.0–34.0)
MCHC: 34.2 g/dL (ref 30.0–36.0)
MCV: 88.2 fL (ref 80.0–100.0)
Monocytes Absolute: 0.8 10*3/uL (ref 0.1–1.0)
Monocytes Relative: 11 %
Neutro Abs: 4.8 10*3/uL (ref 1.7–7.7)
Neutrophils Relative %: 64 %
Platelets: 180 10*3/uL (ref 150–400)
RBC: 5.94 MIL/uL — ABNORMAL HIGH (ref 4.22–5.81)
RDW: 12.9 % (ref 11.5–15.5)
WBC: 7.4 10*3/uL (ref 4.0–10.5)
nRBC: 0 % (ref 0.0–0.2)

## 2023-07-27 LAB — COMPREHENSIVE METABOLIC PANEL WITH GFR
ALT: 16 U/L (ref 0–44)
AST: 22 U/L (ref 15–41)
Albumin: 3.7 g/dL (ref 3.5–5.0)
Alkaline Phosphatase: 70 U/L (ref 38–126)
Anion gap: 13 (ref 5–15)
BUN: 64 mg/dL — ABNORMAL HIGH (ref 8–23)
CO2: 18 mmol/L — ABNORMAL LOW (ref 22–32)
Calcium: 9.3 mg/dL (ref 8.9–10.3)
Chloride: 102 mmol/L (ref 98–111)
Creatinine, Ser: 4.28 mg/dL — ABNORMAL HIGH (ref 0.61–1.24)
GFR, Estimated: 15 mL/min — ABNORMAL LOW (ref >60)
Glucose, Bld: 255 mg/dL — ABNORMAL HIGH (ref 70–99)
Potassium: 4.8 mmol/L (ref 3.5–5.1)
Sodium: 133 mmol/L — ABNORMAL LOW (ref 135–145)
Total Bilirubin: 1.1 mg/dL (ref 0.0–1.2)
Total Protein: 6.9 g/dL (ref 6.5–8.1)

## 2023-07-27 LAB — CK: Total CK: 116 U/L (ref 49–397)

## 2023-07-27 LAB — GLUCOSE, CAPILLARY
Glucose-Capillary: 319 mg/dL — ABNORMAL HIGH (ref 70–99)
Glucose-Capillary: 282 mg/dL — ABNORMAL HIGH (ref 70–99)

## 2023-07-27 LAB — HEMOGLOBIN A1C
Hgb A1c MFr Bld: 7.3 % — ABNORMAL HIGH (ref 4.8–5.6)
Mean Plasma Glucose: 162.81 mg/dL

## 2023-07-27 LAB — CBG MONITORING, ED: Glucose-Capillary: 281 mg/dL — ABNORMAL HIGH (ref 70–99)

## 2023-07-27 LAB — MAGNESIUM: Magnesium: 2.7 mg/dL — ABNORMAL HIGH (ref 1.7–2.4)

## 2023-07-27 MED ORDER — ACETAMINOPHEN 650 MG RE SUPP: 650.0000 mg | Freq: Four times a day (QID) | RECTAL | Status: DC | PRN

## 2023-07-27 MED ORDER — SODIUM CHLORIDE 0.9 % IV SOLN: INTRAVENOUS | Status: DC

## 2023-07-27 MED ORDER — MORPHINE SULFATE (PF) 2 MG/ML IV SOLN: 2.0000 mg | INTRAVENOUS | Status: DC | PRN

## 2023-07-27 MED ORDER — SODIUM CHLORIDE 0.9% FLUSH
3.0000 mL | Freq: Two times a day (BID) | INTRAVENOUS | Status: DC
  Administered 2023-07-27 – 2023-07-28 (×2): 3 mL via INTRAVENOUS

## 2023-07-27 MED ORDER — HYDROCODONE-ACETAMINOPHEN 5-325 MG PO TABS
1.0000 | ORAL_TABLET | ORAL | Status: DC | PRN
  Filled 2023-07-27 (×2): qty 1

## 2023-07-27 MED ORDER — INSULIN ASPART 100 UNIT/ML IJ SOLN
0.0000 [IU] | Freq: Three times a day (TID) | INTRAMUSCULAR | Status: DC
  Administered 2023-07-27: 3 [IU] via SUBCUTANEOUS
  Administered 2023-07-28: 5 [IU] via SUBCUTANEOUS

## 2023-07-27 MED ORDER — ACETAMINOPHEN 325 MG PO TABS
650.0000 mg | ORAL_TABLET | Freq: Four times a day (QID) | ORAL | Status: DC | PRN
Start: 2023-07-27 — End: 2023-07-28

## 2023-07-27 MED ORDER — HEPARIN SODIUM (PORCINE) 5000 UNIT/ML IJ SOLN
5000.0000 [IU] | Freq: Two times a day (BID) | INTRAMUSCULAR | Status: DC
  Administered 2023-07-27 – 2023-07-28 (×2): 5000 [IU] via SUBCUTANEOUS
  Filled 2023-07-27 (×2): qty 1

## 2023-07-27 MED ORDER — SODIUM CHLORIDE 0.9% FLUSH: 3.0000 mL | INTRAVENOUS | Status: DC | PRN

## 2023-07-27 MED ORDER — SODIUM CHLORIDE 0.9% FLUSH
3.0000 mL | Freq: Two times a day (BID) | INTRAVENOUS | Status: DC
  Administered 2023-07-27: 5 mL via INTRAVENOUS
  Administered 2023-07-28: 10 mL via INTRAVENOUS

## 2023-07-27 MED ORDER — TAMSULOSIN HCL 0.4 MG PO CAPS
0.4000 mg | ORAL_CAPSULE | Freq: Every day | ORAL | Status: DC
  Administered 2023-07-27: 0.4 mg via ORAL
  Filled 2023-07-27: qty 1

## 2023-07-27 NOTE — ED Triage Notes (Signed)
 Pt c.o 2 weeks of generalized body aches, denies any other symptoms, no sick contacts
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
## 2023-07-27 NOTE — ED Provider Notes (Addendum)
 [place] EMERGENCY DEPARTMENT AT [facility] Provider Note   CSN: [id number] Arrival date & time: 07/27/23  4098     Patient presents with: Generalized Body Aches   Eduardo Weeks is a 63 y.o. male.   HPI   63 year old male presents emergency department with generalized weakness, body aches.  Recent diagnosis of right-sided kidney stone about 2 months ago.  Was post to follow-up outpatient but had issue with insurance.  He states his symptoms have really been ongoing for the past 2 weeks.  He feels so weak sometimes that he cannot get out of bed.  He had a decreased appetite but denies any other significant symptoms including headache, chest pain, shortness of breath, abdominal pain, vomiting, diarrhea.  He intermittently has dysuria but denies any hematuria.  No documented fever.  Prior to Admission medications   Medication Sig Start Date End Date Taking? Authorizing Provider  ACCU-CHEK GUIDE test strip USE TO MONITOR BLOOD SUGAR FIVE TIMES DAILY 11/08/20   [provider]  aspirin  81 MG chewable tablet Chew 1 tablet (81 mg total) by mouth daily. 11/06/20   Macdonald Savoy, MD  carvedilol  (COREG ) 12.5 MG tablet Take 1 tablet (12.5 mg total) by mouth 2 (two) times daily with a meal. 11/05/20   Macdonald Savoy, MD  clopidogrel  (PLAVIX ) 75 MG tablet Take 1 tablet (75 mg total) by mouth daily. 11/05/20   Macdonald Savoy, MD  dapagliflozin  propanediol (FARXIGA ) 10 MG TABS tablet TAKE 1 TABLET BY MOUTH ONCE DAILY BEFORE BREAKFAST 07/10/23   Swinyer, Leilani Punter, NP  gabapentin  (NEURONTIN ) 800 MG tablet Take 800 mg by mouth See admin instructions. Take up to 4 times a day as needed for neuropathy. 06/14/20   [provider]  insulin  aspart (NOVOLOG ) 100 UNIT/ML injection Inject 4 Units into the skin 3 (three) times daily as needed for high blood sugar.    [provider]  insulin  detemir (LEVEMIR) 100 UNIT/ML FlexPen Inject 45 Units into the skin  daily at 6 (six) AM. 04/11/21   [provider]  lisinopril  (ZESTRIL ) 10 MG tablet Take 10 mg by mouth daily. 03/20/22   [provider]  metFORMIN  (GLUCOPHAGE ) 500 MG tablet Take 1 tablet (500 mg total) by mouth 2 (two) times daily with a meal. 11/12/20   Thukkani, Arun K, MD  nitroGLYCERIN  (NITROSTAT ) 0.4 MG SL tablet Place 1 tablet (0.4 mg total) under the tongue every 5 (five) minutes as needed for chest pain. 11/11/20 12/16/22  Thukkani, Arun K, MD  oxyCODONE  (ROXICODONE ) 5 MG immediate release tablet Take 1 tablet (5 mg total) by mouth every 6 (six) hours as needed for breakthrough pain. 05/06/23   Roemhildt, Lorin T, PA-C  pantoprazole  (PROTONIX ) 40 MG tablet Take 40 mg by mouth daily.    [provider]  rosuvastatin  (CRESTOR ) 40 MG tablet Take 1 tablet (40 mg total) by mouth daily. 01/24/21   Dunn, Dayna N, PA-C  sodium bicarbonate 650 MG tablet Take 650 mg by mouth 2 (two) times daily.    [provider]  tadalafil  (CIALIS ) 10 MG tablet Take 10 mg by mouth daily as needed for erectile dysfunction. 06/18/20   [provider]  tamsulosin  (FLOMAX ) 0.4 MG CAPS capsule Take 1 capsule (0.4 mg total) by mouth daily. 05/06/23   Roemhildt, Lorin T, PA-C  traZODone (DESYREL) 50 MG tablet Take 50 mg by mouth at bedtime as needed for sleep. 09/05/22   [provider]    Allergies:  Onion and Other    Review of Systems  Constitutional:  Positive for fatigue. Negative for fever.  Respiratory:  Negative for shortness of breath.   Cardiovascular:  Negative for chest pain.  Gastrointestinal:  Negative for abdominal pain, diarrhea and vomiting.  Genitourinary:  Positive for dysuria.  Skin:  Negative for rash.  Neurological:  Negative for headaches.    Updated Vital Signs BP 111/79   Pulse 88   Temp 97.7 F (36.5 C) (Oral)   Resp 14   SpO2 99%   Physical Exam Vitals and nursing note reviewed.  Constitutional:      General: He is not in acute  distress.    Appearance: Normal appearance.  HENT:     Head: Normocephalic.     Mouth/Throat:     Mouth: Mucous membranes are moist.   Cardiovascular:     Rate and Rhythm: Normal rate.  Pulmonary:     Effort: Pulmonary effort is normal. No respiratory distress.  Abdominal:     Palpations: Abdomen is soft.     Tenderness: There is no abdominal tenderness.   Skin:    General: Skin is warm.   Neurological:     Mental Status: He is alert and oriented to person, place, and time. Mental status is at baseline.   Psychiatric:        Mood and Affect: Mood normal.     (all labs ordered are listed, but only abnormal results are displayed) Labs Reviewed  CBC WITH DIFFERENTIAL/PLATELET - Abnormal; Notable for the following components:      Result Value   RBC 5.94 (*)    Hemoglobin 17.9 (*)    HCT 52.4 (*)    All other components within normal limits  COMPREHENSIVE METABOLIC PANEL WITH GFR  URINALYSIS, ROUTINE W REFLEX MICROSCOPIC    EKG: None  Radiology: No results found.   Procedures   Medications Ordered in the ED - No data to display                                  Medical Decision Making Amount and/or Complexity of Data Reviewed Labs: ordered. Radiology: ordered.  Risk Decision regarding hospitalization.   63 year old male presents emergency department with generalized weakness, body aches.  Recent diagnosis of a kidney stone a couple months ago, unclear if he passed it.  He has intermittent dysuria but denies any other acute symptoms.  Vitals are normal and stable.  Abdomen is benign.  Blood work shows worsening kidney dysfunction, creatinine is 4.28 up from a baseline around 2.3, urinalysis shows no acute infection.  CT renal does not identify an ongoing kidney stone/hydronephrosis.  However in light of his worsening kidney dysfunction with a creatinine at 4.28 from a baseline of 2 we will plan for admission and nephrology consult.  Consulted nephrologist  Dr. Yvonnie Heritage, nephrology will see the patient as an inpatient.  Patients evaluation and results requires admission for further treatment and care.  Spoke with hospitalist, reviewed patient's ED course and they accept admission.  Patient agrees with admission plan, offers no new complaints and is stable/unchanged at time of admit.     Final diagnoses:  None    ED Discharge Orders     None          Flonnie Humphrey, DO 07/27/23 1500    Flonnie Humphrey, DO 07/27/23 1519

## 2023-07-27 NOTE — Progress Notes (Signed)
 Psychosocial Progressive/Outcome: ANOx4, calm and cooperative   Pain/Comfort Progression/Outcome: Pt did not complain of pain   Clinical Progression/Outcome: Independent in positioning  SBA Maintained safety
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
## 2023-07-28 ENCOUNTER — Other Ambulatory Visit (HOSPITAL_COMMUNITY): Payer: Self-pay

## 2023-07-28 DIAGNOSIS — N179 Acute kidney failure, unspecified: Secondary | ICD-10-CM | POA: Diagnosis not present

## 2023-07-28 LAB — GLUCOSE, CAPILLARY: Glucose-Capillary: 259 mg/dL — ABNORMAL HIGH (ref 70–99)

## 2023-07-28 LAB — COMPREHENSIVE METABOLIC PANEL WITH GFR
ALT: 13 U/L (ref 0–44)
AST: 17 U/L (ref 15–41)
Albumin: 3.3 g/dL — ABNORMAL LOW (ref 3.5–5.0)
Alkaline Phosphatase: 63 U/L (ref 38–126)
Anion gap: 11 (ref 5–15)
BUN: 63 mg/dL — ABNORMAL HIGH (ref 8–23)
CO2: 20 mmol/L — ABNORMAL LOW (ref 22–32)
Calcium: 9.1 mg/dL (ref 8.9–10.3)
Chloride: 105 mmol/L (ref 98–111)
Creatinine, Ser: 3.48 mg/dL — ABNORMAL HIGH (ref 0.61–1.24)
GFR, Estimated: 19 mL/min — ABNORMAL LOW (ref >60)
Glucose, Bld: 267 mg/dL — ABNORMAL HIGH (ref 70–99)
Potassium: 4.1 mmol/L (ref 3.5–5.1)
Sodium: 136 mmol/L (ref 135–145)
Total Bilirubin: 0.7 mg/dL (ref 0.0–1.2)
Total Protein: 6.4 g/dL — ABNORMAL LOW (ref 6.5–8.1)

## 2023-07-28 LAB — CBC
HCT: 45.9 % (ref 39.0–52.0)
Hemoglobin: 15.9 g/dL (ref 13.0–17.0)
MCH: 30.3 pg (ref 26.0–34.0)
MCHC: 34.6 g/dL (ref 30.0–36.0)
MCV: 87.6 fL (ref 80.0–100.0)
Platelets: 160 10*3/uL (ref 150–400)
RBC: 5.24 MIL/uL (ref 4.22–5.81)
RDW: 13 % (ref 11.5–15.5)
WBC: 5.6 10*3/uL (ref 4.0–10.5)
nRBC: 0 % (ref 0.0–0.2)

## 2023-07-28 LAB — HIV ANTIBODY (ROUTINE TESTING W REFLEX): HIV Screen 4th Generation wRfx: NONREACTIVE

## 2023-07-28 MED ORDER — TRESIBA FLEXTOUCH 200 UNIT/ML ~~LOC~~ SOPN
30.0000 [IU] | PEN_INJECTOR | SUBCUTANEOUS | Status: AC
Start: 2023-07-28 — End: ?

## 2023-07-28 MED ORDER — TAMSULOSIN HCL 0.4 MG PO CAPS
0.4000 mg | ORAL_CAPSULE | Freq: Every day | ORAL | 0 refills | Status: AC
  Filled 2023-07-28: qty 90, 90d supply, fill #0

## 2023-07-28 MED ORDER — GABAPENTIN 100 MG PO CAPS
100.0000 mg | ORAL_CAPSULE | Freq: Three times a day (TID) | ORAL | 1 refills | Status: AC
  Filled 2023-07-28: qty 90, 30d supply, fill #0

## 2023-07-28 MED ORDER — ATORVASTATIN CALCIUM 80 MG PO TABS
80.0000 mg | ORAL_TABLET | Freq: Every day | ORAL | 0 refills | Status: AC
  Filled 2023-07-28: qty 90, 90d supply, fill #0

## 2023-07-28 NOTE — Progress Notes (Signed)
 Went over discharge paperwork with pt, all questions were answered. Pt left with all belongings and picked up medication at [facility].
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
## 2023-07-28 NOTE — Plan of Care (Signed)

## 2023-07-28 NOTE — Discharge Summary (Signed)
 Physician Discharge Summary  Eduardo Weeks ZOX:[id number] DOB: 12/01/1960 DOA: 07/27/2023  PCP: Eduardo Dance, NP  Admit date: 07/27/2023 Discharge date: 07/28/2023  Time spent: 40 minutes  Recommendations for Outpatient Follow-up:  Follow outpatient CBC/CMP  Follow blood pressure outpatient Holding coreg , consider resumption outpatient pending blood pressure Holding lisinopril /farxiga , repeat labs and follow bp and consider whether to hold indefinitely or resume Follow with renal outpatient  Crestor  switched to lipitor given aki on ckd Gabapentin  reduced Insulin  dose reduced due to reported recurrent BG's in 70's at home - adjust insulin  regimen outpatient as indicated Concern for poor pedal pulses at presentation, on my exam, 1+ DP/PT pulses throughout, no sx convincing of claudication - follow outpatient to decide on ABI   Discharge Diagnoses:  Principal Problem:   AKI (acute kidney injury) (HCC)   Discharge Condition: stable  Diet recommendation: heart healthy, diabetic  Filed Weights   07/28/23 0500  Weight: 75.5 kg    History of present illness:   Eduardo Weeks is Eduardo Weeks 63 y.o. male with past medical history  of ongoing heavy tobacco abuse, heart disease, CKD stage IIIb, chronic hepatitis C, essential hypertension, history of CVA, history of alcohol and cocaine abuse admitted today with just generalized bodyaches and found to have AKI on CKD stage IIIb.    Hospital Course:  Assessment and Plan:  AKI on CKD IV Suspected due to prerenal insults and possible ischemia with hypotension noted Baseline creatinine appears to be around 3 based on 04/2023 labs Presented with creatinine 4.28 Improved with IVF today, 3.48 (not back to baseline) CT without hydro  UA with 100 mg/dl protein, 0-5 RBC's Seen by renal - recommending stopping farxiga , lisinopril .  Follow up with renal outpatient.  Reduce gabapentin .   Hypertension With hypotension at presentation - Holding  coreg , lisinopril  at time of discharge  General Malaise Suspect related to hypotension above  T2DM Hold farxiga  He hasn't been taking mealtime insulin  for Eduardo Weeks while - will stop this Only on SSI here.  He notes at home, has BG's in 70's 3 times Eduardo Weeks week.  Recommended he reduce basal to 30 units and follow with his outpatient doctor.  Reduce dose gabapentin    CAD s/p Stent Resume DAPT Switch crestor  to lipitor due to AKI  Hx CVA Continue statin, antiplatelets  Concern for poor pedal pulses He has palpable PT/DP pulses bilaterally on my exam.  Denies claudication.  Consider ABI outpatient in follow up with PCP.      Procedures: none   Consultations: Renal   Discharge Exam: Vitals:   07/28/23 0542 07/28/23 0836  BP: (!) 125/96 113/74  Pulse: 75 71  Resp: 18 17  Temp: 97.7 F (36.5 C) 97.7 F (36.5 C)  SpO2: 98% 97%   No complaints Eager to go home  General: No acute distress. Cardiovascular: RRR Lungs: unlabored Abdomen: Soft, nontender, nondistended  Neurological: Alert and oriented 3. Moves all extremities 4. Cranial nerves II through XII grossly intact. Extremities: No clubbing or cyanosis. No edema. Palpable DP and PT pulses, 1+ bilaterally   Discharge Instructions   Discharge Instructions     Call MD for:  difficulty breathing, headache or visual disturbances   Complete by: As directed    Call MD for:  extreme fatigue   Complete by: As directed    Call MD for:  hives   Complete by: As directed    Call MD for:  persistant dizziness or light-headedness   Complete by: As directed    Call  MD for:  persistant nausea and vomiting   Complete by: As directed    Call MD for:  redness, tenderness, or signs of infection (pain, swelling, redness, odor or green/yellow discharge around incision site)   Complete by: As directed    Call MD for:  severe uncontrolled pain   Complete by: As directed    Call MD for:  temperature >100.4   Complete by: As directed     Diet - low sodium heart healthy   Complete by: As directed    Discharge instructions   Complete by: As directed    You were seen for acute kidney injury.    You've improved with supportive care and IV fluids.  We've stopped your lisinopril  and farxiga .  Hold your coreg  until you follow up with your outpatient providers to follow up on your blood pressure.  The kidney doctors are arranging outpatient follow up.  Because of your decreased kidney function, we're decreasing your gabapentin  dose.  Follow with your outpatient doctor to determine if this can be adjusted.    We've also started you on Eduardo Weeks medicine called flomax  for Eduardo Weeks possibly enlarged prostate.   I recommend you reduce your insulin  as it sounds like you have relatively frequent low blood sugars at home.  Follow this with your endocrinologist outpatient.  Return for new, recurrent, or worsening symptoms.  Please ask your PCP to request records from this hospitalization so they know what was done and what the next steps will be.   Increase activity slowly   Complete by: As directed       Allergies as of 07/28/2023       Reactions   Onion Anaphylaxis, Swelling   TONGUE AND FACIAL SWELLING   Other Other (See Comments)   Onions-throat swelling        Medication List     PAUSE taking these medications    carvedilol  12.5 MG tablet Wait to take this until your doctor or other care provider tells you to start again. Follow with your outpatient PCP regarding when to resume your coreg  Commonly known as: COREG  Take 1 tablet (12.5 mg total) by mouth 2 (two) times daily with Eduardo Weeks meal.       STOP taking these medications    Farxiga  10 MG Tabs tablet Generic drug: dapagliflozin  propanediol   insulin  aspart 100 UNIT/ML injection Commonly known as: novoLOG    lisinopril  10 MG tablet Commonly known as: ZESTRIL    nitroGLYCERIN  0.4 MG SL tablet Commonly known as: Nitrostat    rosuvastatin  40 MG tablet Commonly known as:  CRESTOR        TAKE these medications    Accu-Chek Guide test strip Generic drug: glucose blood USE TO MONITOR BLOOD SUGAR FIVE TIMES DAILY   atorvastatin 80 MG tablet Commonly known as: Lipitor Take 1 tablet (80 mg total) by mouth daily.   clopidogrel  75 MG tablet Commonly known as: PLAVIX  Take 1 tablet (75 mg total) by mouth daily.   cyclobenzaprine 5 MG tablet Commonly known as: FLEXERIL Take 5 mg by mouth 3 (three) times daily as needed for muscle spasms.   gabapentin  100 MG capsule Commonly known as: Neurontin  Take 1 capsule (100 mg total) by mouth 3 (three) times daily. What changed:  medication strength how much to take   pantoprazole  40 MG tablet Commonly known as: PROTONIX  Take 40 mg by mouth daily.   tamsulosin  0.4 MG Caps capsule Commonly known as: FLOMAX  Take 1 capsule (0.4 mg total) by mouth daily after  supper.   traZODone 50 MG tablet Commonly known as: DESYREL Take 50 mg by mouth at bedtime as needed for sleep.   Tresiba FlexTouch 200 UNIT/ML FlexTouch Pen Generic drug: insulin  degludec Inject 30 Units into the skin daily. Follow with your endocrinologist to discuss your blood sugars and your insulin  What changed:  how much to take additional instructions   TYLENOL  COLD/FLU SEVERE PO Take 1 Capful by mouth as needed.       Allergies  Allergen Reactions   Onion Anaphylaxis and Swelling    TONGUE AND FACIAL SWELLING   Other Other (See Comments)    Onions-throat swelling      The results of significant diagnostics from this hospitalization (including imaging, microbiology, ancillary and laboratory) are listed below for reference.    Significant Diagnostic Studies: CT Renal Stone Study Result Date: 07/27/2023 CLINICAL DATA:  Abdominal and flank pain for 2 weeks. Nephrolithiasis. EXAM: CT ABDOMEN AND PELVIS WITHOUT CONTRAST TECHNIQUE: Multidetector CT imaging of the abdomen and pelvis was performed following the standard protocol without  IV contrast. RADIATION DOSE REDUCTION: This exam was performed according to the departmental dose-optimization program which includes automated exposure control, adjustment of the mA and/or kV according to patient size and/or use of iterative reconstruction technique. COMPARISON:  05/06/2023 FINDINGS: Lower chest: No acute findings. Hepatobiliary: No mass visualized on this unenhanced exam. Gallbladder is unremarkable. No evidence of biliary ductal dilatation. Pancreas: No mass or inflammatory process visualized on this unenhanced exam. Spleen:  Within normal limits in size. Adrenals/Urinary tract: No evidence of urolithiasis or hydronephrosis. Unremarkable unopacified urinary bladder. Stomach/Bowel: No evidence of obstruction, inflammatory process, or abnormal fluid collections. Normal appendix visualized. Vascular/Lymphatic: No pathologically enlarged lymph nodes identified. No evidence of abdominal aortic aneurysm. Reproductive:  No mass or other significant abnormality. Other:  None. Musculoskeletal: No suspicious bone lesions identified. Old pelvic fracture deformity is again seen with internal fixation hardware across the pubic symphysis. IMPRESSION: No evidence of urolithiasis, hydronephrosis, or other acute findings. Electronically Signed   By: Marlyce Sine M.D.   On: 07/27/2023 14:01    Microbiology: No results found for this or any previous visit (from the past 240 hours).   Labs: Basic Metabolic Panel: Recent Labs  Lab 07/27/23 1016 07/27/23 1722 07/28/23 0332  NA 133*  --  136  K 4.8  --  4.1  CL 102  --  105  CO2 18*  --  20*  GLUCOSE 255*  --  267*  BUN 64*  --  63*  CREATININE 4.28*  --  3.48*  CALCIUM  9.3  --  9.1  MG  --  2.7*  --    Liver Function Tests: Recent Labs  Lab 07/27/23 1016 07/28/23 0332  AST 22 17  ALT 16 13  ALKPHOS 70 63  BILITOT 1.1 0.7  PROT 6.9 6.4*  ALBUMIN 3.7 3.3*   No results for input(s): LIPASE, AMYLASE in the last 168 hours. No results  for input(s): AMMONIA in the last 168 hours. CBC: Recent Labs  Lab 07/27/23 1016 07/28/23 0332  WBC 7.4 5.6  NEUTROABS 4.8  --   HGB 17.9* 15.9  HCT 52.4* 45.9  MCV 88.2 87.6  PLT 180 160   Cardiac Enzymes: Recent Labs  Lab 07/27/23 1722  CKTOTAL 116   BNP: BNP (last 3 results) No results for input(s): BNP in the last 8760 hours.  ProBNP (last 3 results) No results for input(s): PROBNP in the last 8760 hours.  CBG: Recent Labs  Lab 07/27/23 1720 07/27/23 1820 07/27/23 2000 07/28/23 0819  GLUCAP 281* 319* 282* 259*       Signed:  Donnetta Gains MD.  [facility] 07/28/2023, 1:53 PM

## 2023-09-10 ENCOUNTER — Other Ambulatory Visit: Payer: Self-pay | Admitting: Interventional Cardiology

## 2023-11-19 ENCOUNTER — Encounter: Payer: Self-pay | Admitting: Internal Medicine

## 2023-11-19 ENCOUNTER — Ambulatory Visit: Payer: MEDICAID | Attending: Internal Medicine | Admitting: Internal Medicine

## 2023-11-19 VITALS — BP 150/91 | HR 89 | Resp 16 | Ht 69.0 in | Wt 183.0 lb

## 2023-11-19 DIAGNOSIS — E119 Type 2 diabetes mellitus without complications: Secondary | ICD-10-CM | POA: Diagnosis not present

## 2023-11-19 DIAGNOSIS — N1832 Chronic kidney disease, stage 3b: Secondary | ICD-10-CM

## 2023-11-19 DIAGNOSIS — I25118 Atherosclerotic heart disease of native coronary artery with other forms of angina pectoris: Secondary | ICD-10-CM

## 2023-11-19 DIAGNOSIS — E782 Mixed hyperlipidemia: Secondary | ICD-10-CM

## 2023-11-19 DIAGNOSIS — Z720 Tobacco use: Secondary | ICD-10-CM | POA: Diagnosis present

## 2023-11-19 DIAGNOSIS — I10 Essential (primary) hypertension: Secondary | ICD-10-CM

## 2023-11-19 DIAGNOSIS — Z794 Long term (current) use of insulin: Secondary | ICD-10-CM

## 2023-11-19 MED ORDER — CARVEDILOL 6.25 MG PO TABS: 6.2500 mg | ORAL_TABLET | Freq: Two times a day (BID) | ORAL | 1 refills | Status: AC

## 2023-11-19 MED ORDER — NITROGLYCERIN 0.4 MG SL SUBL: 0.4000 mg | SUBLINGUAL_TABLET | SUBLINGUAL | 11 refills | Status: AC | PRN

## 2023-11-19 NOTE — Patient Instructions (Signed)
 Medication Instructions:  Start Carvedilol  6.25 mg twice a day Continue all other medications  *If you need a refill on your cardiac medications before your next appointment, please call your pharmacy*  Lab Work: None ordered  Testing/Procedures: None ordered  Follow-Up: At [facility], you and your health needs are our priority.  As part of our continuing mission to provide you with exceptional heart care, our providers are all part of one team.  This team includes your primary Cardiologist (physician) and Advanced Practice Providers or APPs (Physician Assistants and Nurse Practitioners) who all work together to provide you with the care you need, when you need it.  Your next appointment:  1 year   Call in May to schedule Oct appointment     Provider:  Dr.Segal    We recommend signing up for the patient portal called MyChart.  Sign up information is provided on this After Visit Summary.  MyChart is used to connect with patients for Virtual Visits (Telemedicine).  Patients are able to view lab/test results, encounter notes, upcoming appointments, etc.  Non-urgent messages can be sent to your provider as well.   To learn more about what you can do with MyChart, go to [email or web address].

## 2023-11-19 NOTE — Progress Notes (Signed)
 Cardiology Office Note   Date:  11/19/2023  ID:  Eduardo Weeks, DOB [birth year], MRN [id number] PCP: Elnor Fairy HERO, NP  [facility] Providers Cardiologist:  Emeline FORBES Calender, MD     History of Present Illness Eduardo Weeks is a 63 y.o. male with a past medical history of hypertension, hyperlipidemia, insulin -dependent diabetes, CKD 3B, ischemic CVA, alcohol use, former cocaine use (quit 15 years ago), tobacco use, multivessel CAD not deemed a candidate for CABG s/p DES x 2 to proximal and distal RCA and DES x 2 to diagonal who presents today for routine follow-up.  He states that he occasionally gets brief episodes of chest discomfort when he is exerting himself but these are brief and not worsening in intensity.  He was able to go to a recent sports game and go up the stairs without any issue.  He also recently had sexual relations and did have chest discomfort.  It has been similar in quality and is not worsening.  He takes nitroglycerin  and it improves his symptoms when this happens.  Otherwise no complaints.  He was recently hospitalized in June with an AKI and his lisinopril  and Farxiga  was discontinued at that time.  His carvedilol  also was discontinued due to hypotension.  Since that time his lisinopril  has been reinstated.  Tobacco use: 1 to 2 cigarettes daily   ROS:  Review of Systems  All other systems reviewed and are negative.   Physical Exam  Physical Exam Vitals and nursing note reviewed.  Constitutional:      Appearance: Normal appearance.  HENT:     Head: Normocephalic and atraumatic.  Eyes:     Conjunctiva/sclera: Conjunctivae normal.  Neck:     Vascular: No carotid bruit.  Cardiovascular:     Rate and Rhythm: Normal rate and regular rhythm.  Pulmonary:     Effort: Pulmonary effort is normal.     Breath sounds: Normal breath sounds.  Musculoskeletal:        General: No swelling or tenderness.  Skin:    Coloration: Skin is not  jaundiced or pale.  Neurological:     Mental Status: He is alert.     VS:  BP (!) 150/91 (BP Location: Left Arm, Patient Position: Sitting, Cuff Size: Normal)   Pulse 89   Resp 16   Ht 5' 9 (1.753 m)   Wt 183 lb (83 kg)   SpO2 98%   BMI 27.02 kg/m         Wt Readings from Last 3 Encounters:  11/19/23 183 lb (83 kg)  07/28/23 166 lb 7.2 oz (75.5 kg)  12/16/22 174 lb (78.9 kg)     EKG Interpretation Date/Time:  Monday November 19 2023 15:33:48 EDT Ventricular Rate:  89 PR Interval:  144 QRS Duration:  90 QT Interval:  338 QTC Calculation: 411 R Axis:   18  Text Interpretation: Normal sinus rhythm Possible Left atrial enlargement T wave abnormality, consider inferolateral ischemia When compared with ECG of 11-Nov-2020 05:13, No significant change was found Confirmed by Calender Emeline [phone]) on 11/19/2023 3:39:42 PM    Studies Reviewed   Left heart catheterization 11/03/2020:   Prox Cx to Dist Cx lesion is 100% stenosed.   Dist LAD lesion is 80% stenosed.   2nd Diag lesion is 90% stenosed.   1st Diag lesion is 80% stenosed.   Prox RCA lesion is 90% stenosed.   Mid RCA lesion is 60% stenosed.   Dist RCA lesion is 70%  stenosed.   RPDA lesion is 80% stenosed.  Echocardiogram 11/03/2020:   1. Left ventricular ejection fraction, by estimation, is 55 to 60%. The  left ventricle has normal function. The left ventricle has no definite  regional wall motion abnormalities. There is moderate left ventricular  hypertrophy. Left ventricular diastolic   parameters were normal.   2. Right ventricular systolic function is normal. The right ventricular  size is normal. Tricuspid regurgitation signal is inadequate for assessing  PA pressure.   3. The mitral valve is grossly normal/mildly degenerative. Mild to  moderate mitral valve regurgitation. No evidence of mitral stenosis.   4. The aortic valve is tricuspid. Aortic valve regurgitation is not  visualized. No aortic stenosis is  present.   5. The inferior vena cava is normal in size with greater than 50%  respiratory variability, suggesting right atrial pressure of 3 mmHg.     Risk Assessment/Calculations       ASSESSMENT  Multivessel CAD s/p PCI with DES x 2 to RCA and DES x 2 to diagonal with stable angina was not deemed a candidate for CABG in the past due to substance use.  Currently on clopidogrel  antiplatelet monotherapy, atorvastatin  80 mg. Hypertension BP 150/91.  On lisinopril  Hyperlipidemia Tobacco use Former cocaine use CKD 3B  Plan  Restart carvedilol  6.25 mg twice daily which should help with angina and hypertension If he continues to have stable angina we can consider adding on long-acting nitrates Tobacco cessation counseling provided Cardiac risk counseling and prevention recommendations: Heart healthy/Mediterranean diet with whole grains, fruits, vegetable, fish, lean meats, nuts, and olive oil. Limit salt. Moderate walking, 3-5 times/week for 30-50 minutes each session. Aim for at least 150 minutes.week. Goal should be pace of 3 miles/hour, or walking 1.5 miles in 30 minutes Avoidance of tobacco products. Avoid excess alcohol.  Follow up: 1 year          Signed, Emeline FORBES Calender, MD

## 2023-11-19 NOTE — Addendum Note (Signed)
 Addended by: CHRISTIANNE CHANNING PARAS on: 11/19/2023 04:18 PM   Modules accepted: Orders

## 2024-01-24 ENCOUNTER — Other Ambulatory Visit: Payer: Self-pay

## 2024-01-24 ENCOUNTER — Emergency Department (HOSPITAL_COMMUNITY): Payer: MEDICAID

## 2024-01-24 ENCOUNTER — Emergency Department (HOSPITAL_COMMUNITY)
Admission: EM | Admit: 2024-01-24 | Discharge: 2024-01-25 | Disposition: A | Discharge status: Home / Self Care | Payer: MEDICAID | Attending: Emergency Medicine | Admitting: Emergency Medicine

## 2024-01-24 DIAGNOSIS — Z794 Long term (current) use of insulin: Secondary | ICD-10-CM | POA: Insufficient documentation

## 2024-01-24 DIAGNOSIS — E119 Type 2 diabetes mellitus without complications: Secondary | ICD-10-CM | POA: Insufficient documentation

## 2024-01-24 DIAGNOSIS — K209 Esophagitis, unspecified without bleeding: Secondary | ICD-10-CM | POA: Insufficient documentation

## 2024-01-24 DIAGNOSIS — Z79899 Other long term (current) drug therapy: Secondary | ICD-10-CM | POA: Insufficient documentation

## 2024-01-24 DIAGNOSIS — I129 Hypertensive chronic kidney disease with stage 1 through stage 4 chronic kidney disease, or unspecified chronic kidney disease: Secondary | ICD-10-CM | POA: Insufficient documentation

## 2024-01-24 DIAGNOSIS — N189 Chronic kidney disease, unspecified: Secondary | ICD-10-CM | POA: Insufficient documentation

## 2024-01-24 LAB — COMPREHENSIVE METABOLIC PANEL WITH GFR
ALT: 9 U/L (ref 0–44)
AST: 24 U/L (ref 15–41)
Albumin: 4 g/dL (ref 3.5–5.0)
Alkaline Phosphatase: 83 U/L (ref 38–126)
Anion gap: 16 — ABNORMAL HIGH (ref 5–15)
BUN: 41 mg/dL — ABNORMAL HIGH (ref 8–23)
CO2: 22 mmol/L (ref 22–32)
Calcium: 9.5 mg/dL (ref 8.9–10.3)
Chloride: 95 mmol/L — ABNORMAL LOW (ref 98–111)
Creatinine, Ser: 2.51 mg/dL — ABNORMAL HIGH (ref 0.61–1.24)
GFR, Estimated: 28 mL/min — ABNORMAL LOW (ref >60)
Glucose, Bld: 189 mg/dL — ABNORMAL HIGH (ref 70–99)
Potassium: 4.2 mmol/L (ref 3.5–5.1)
Sodium: 133 mmol/L — ABNORMAL LOW (ref 135–145)
Total Bilirubin: 1.1 mg/dL (ref 0.0–1.2)
Total Protein: 7.8 g/dL (ref 6.5–8.1)

## 2024-01-24 LAB — RESP PANEL BY RT-PCR (RSV, FLU A&B, COVID)  RVPGX2
Influenza A by PCR: NEGATIVE
Influenza B by PCR: NEGATIVE
Resp Syncytial Virus by PCR: NEGATIVE
SARS Coronavirus 2 by RT PCR: NEGATIVE

## 2024-01-24 LAB — CBC
HCT: 54.3 % — ABNORMAL HIGH (ref 39.0–52.0)
Hemoglobin: 19.2 g/dL — ABNORMAL HIGH (ref 13.0–17.0)
MCH: 30.8 pg (ref 26.0–34.0)
MCHC: 35.4 g/dL (ref 30.0–36.0)
MCV: 87.2 fL (ref 80.0–100.0)
Platelets: 233 K/uL (ref 150–400)
RBC: 6.23 MIL/uL — ABNORMAL HIGH (ref 4.22–5.81)
RDW: 13 % (ref 11.5–15.5)
WBC: 10.9 K/uL — ABNORMAL HIGH (ref 4.0–10.5)
nRBC: 0 % (ref 0.0–0.2)

## 2024-01-24 LAB — LIPASE, BLOOD: Lipase: 31 U/L (ref 11–51)

## 2024-01-24 MED ORDER — ALUM & MAG HYDROXIDE-SIMETH 200-200-20 MG/5ML PO SUSP
30.0000 mL | Freq: Once | ORAL | Status: AC
  Administered 2024-01-24: 30 mL via ORAL
  Filled 2024-01-24: qty 30

## 2024-01-24 MED ORDER — ONDANSETRON HCL 4 MG/2ML IJ SOLN
4.0000 mg | Freq: Once | INTRAMUSCULAR | Status: AC
  Administered 2024-01-24: 4 mg via INTRAVENOUS
  Filled 2024-01-24: qty 2

## 2024-01-24 MED ORDER — LIDOCAINE VISCOUS HCL 2 % MT SOLN
15.0000 mL | Freq: Once | OROMUCOSAL | Status: AC
  Administered 2024-01-24: 15 mL via OROMUCOSAL
  Filled 2024-01-24: qty 15

## 2024-01-24 MED ORDER — SODIUM CHLORIDE 0.9 % IV BOLUS
500.0000 mL | Freq: Once | INTRAVENOUS | Status: AC
  Administered 2024-01-24: 500 mL via INTRAVENOUS

## 2024-01-24 MED ORDER — METOCLOPRAMIDE HCL 5 MG/ML IJ SOLN
5.0000 mg | Freq: Once | INTRAMUSCULAR | Status: AC
  Administered 2024-01-24: 5 mg via INTRAVENOUS
  Filled 2024-01-24: qty 2

## 2024-01-24 MED ORDER — FAMOTIDINE IN NACL 20-0.9 MG/50ML-% IV SOLN
20.0000 mg | Freq: Once | INTRAVENOUS | Status: AC
  Administered 2024-01-24: 20 mg via INTRAVENOUS
  Filled 2024-01-24: qty 50

## 2024-01-24 MED ORDER — MAALOX MAX 400-400-40 MG/5ML PO SUSP: 5.0000 mL | Freq: Four times a day (QID) | ORAL | 0 refills | Status: AC | PRN

## 2024-01-24 NOTE — ED Triage Notes (Addendum)
 Patient BIB EMS c/o LLQ abdominal pain and  emesis x 4 days. Per report patient unable to keep anything down today since Sunday. Patient denies Chest pain and SOB. Patient denies dysuria.   BP 160/84 HR 88 RR 20 O2sat 96 on RA CBG 195

## 2024-01-24 NOTE — Discharge Instructions (Signed)
 You have been seen and discharged from the emergency department.  You were diagnosed with esophagitis.  Continue taking your Protonix .  Use Maalox/drink as needed.  Establish care with gastroenterology for further evaluation and treatment.  Follow-up with your primary provider for further evaluation and further care. Take home medications as prescribed. If you have any worsening symptoms or further concerns for your health please return to an emergency department for further evaluation.

## 2024-01-24 NOTE — ED Provider Notes (Addendum)
 [place] EMERGENCY DEPARTMENT AT [facility] Provider Note   CSN: [id number] Arrival date & time: 01/24/24  1735     Patient presents with: Emesis and Abdominal Pain   [place] Foerster is a 63 y.o. male.   HPI   63 year old male with past medical history of HTN, DM, CKD presents emergency department with 4 days of left-sided abdominal pain associated with nausea/vomiting.  Patient states the pain is on the left sided, lower in the abdomen.  He has been having decreased appetite and nonbloody emesis.  Denies any diarrhea or documented fever but endorses chills.  He has no chest pain or shortness of breath.  He has not been having any dysuria or urinary frequency.  No history of diverticulitis.  Prior to Admission medications  Medication Sig Start Date End Date Taking? Authorizing Provider  ACCU-CHEK GUIDE test strip USE TO MONITOR BLOOD SUGAR FIVE TIMES DAILY 11/08/20   [provider]  amLODipine  (NORVASC ) 5 MG tablet Take 5 mg by mouth. 09/27/23   [provider]  atorvastatin  (LIPITOR) 80 MG tablet Take 1 tablet (80 mg total) by mouth daily. 07/28/23 11/19/23  Perri DELENA Meliton Mickey., MD  carvedilol  (COREG ) 6.25 MG tablet Take 1 tablet (6.25 mg total) by mouth 2 (two) times daily with a meal. 11/19/23 02/17/24  Kriste Emeline BRAVO, DO  clopidogrel  (PLAVIX ) 75 MG tablet Take 1 tablet (75 mg total) by mouth daily. 11/05/20   Odell Celinda Balo, MD  gabapentin  (NEURONTIN ) 100 MG capsule Take 1 capsule (100 mg total) by mouth 3 (three) times daily. 07/28/23 11/19/23  Perri DELENA Meliton Mickey., MD  insulin  aspart (NOVOLOG ) 100 UNIT/ML injection Inject 5 Units into the skin 3 (three) times daily before meals. Patient taking differently: Inject 5 Units into the skin 3 (three) times daily before meals. Sliding scale depending on levels    [provider]  lisinopril  (ZESTRIL ) 5 MG tablet Take 5 mg by mouth daily. 09/26/23   [provider]  nitroGLYCERIN   (NITROSTAT ) 0.4 MG SL tablet Place 1 tablet (0.4 mg total) under the tongue every 5 (five) minutes as needed for chest pain. 11/19/23   Segal, Jared E, DO  pantoprazole  (PROTONIX ) 40 MG tablet Take 40 mg by mouth daily.    [provider]  Phenylephrine-DM-GG-APAP (TYLENOL  COLD/FLU SEVERE PO) Take 1 Capful by mouth as needed.    [provider]  TRESIBA  FLEXTOUCH 200 UNIT/ML FlexTouch Pen Inject 30 Units into the skin daily. Follow with your endocrinologist to discuss your blood sugars and your insulin  07/28/23   Perri DELENA Meliton Mickey., MD    Allergies: Onion and Other    Review of Systems  Constitutional:  Positive for appetite change, chills and fatigue. Negative for fever.  Respiratory:  Negative for shortness of breath.   Cardiovascular:  Negative for chest pain.  Gastrointestinal:  Positive for abdominal pain, nausea and vomiting. Negative for blood in stool and diarrhea.  Genitourinary:  Negative for dysuria and frequency.  Musculoskeletal:  Negative for back pain.  Skin:  Negative for rash.  Neurological:  Negative for headaches.    Updated Vital Signs BP (!) 173/94   Pulse 91   Temp 98.2 F (36.8 C)   Resp 18   SpO2 96%   Physical Exam Vitals and nursing note reviewed.  Constitutional:      Appearance: Normal appearance.     Comments: Actively vomiting into emesis bag  HENT:     Head: Normocephalic.  Mouth/Throat:     Mouth: Mucous membranes are moist.  Cardiovascular:     Rate and Rhythm: Normal rate.  Pulmonary:     Effort: Pulmonary effort is normal. No respiratory distress.  Abdominal:     General: Bowel sounds are decreased. There is no distension.     Palpations: Abdomen is soft.     Tenderness: There is generalized abdominal tenderness. There is no guarding or rebound.  Skin:    General: Skin is warm.  Neurological:     Mental Status: He is alert and oriented to person, place, and time. Mental status is at baseline.  Psychiatric:         Mood and Affect: Mood normal.     (all labs ordered are listed, but only abnormal results are displayed) Labs Reviewed  COMPREHENSIVE METABOLIC PANEL WITH GFR - Abnormal; Notable for the following components:      Result Value   Sodium 133 (*)    Chloride 95 (*)    Glucose, Bld 189 (*)    BUN 41 (*)    Creatinine, Ser 2.51 (*)    GFR, Estimated 28 (*)    Anion gap 16 (*)    All other components within normal limits  CBC - Abnormal; Notable for the following components:   WBC 10.9 (*)    RBC 6.23 (*)    Hemoglobin 19.2 (*)    HCT 54.3 (*)    All other components within normal limits  RESP PANEL BY RT-PCR (RSV, FLU A&B, COVID)  RVPGX2  LIPASE, BLOOD  URINALYSIS, ROUTINE W REFLEX MICROSCOPIC    EKG: None  Radiology: No results found.   Procedures   Medications Ordered in the ED  ondansetron  (ZOFRAN ) injection 4 mg (4 mg Intravenous Given 01/24/24 1804)  famotidine (PEPCID) IVPB 20 mg premix (20 mg Intravenous New Bag/Given 01/24/24 2101)  sodium chloride  0.9 % bolus 500 mL (500 mLs Intravenous New Bag/Given 01/24/24 2101)  metoCLOPramide (REGLAN) injection 5 mg (5 mg Intravenous Given 01/24/24 2100)                                    Medical Decision Making Amount and/or Complexity of Data Reviewed Labs: ordered. Radiology: ordered.  Risk OTC drugs. Prescription drug management.   63 year old male presents emergency department with nausea/vomiting, left-sided abdominal pain.  Vitals are normal and stable, abdomen is soft, benign, mild tenderness on the left.  Blood work shows mild leukocytosis of 10.9.  He has stable/slightly improved CKD, abdominal labs are otherwise normal.  After the first round of IV medicine vomiting has stopped but he now has hiccups.  CT of the abdomen pelvis is significant for esophagitis, no other acute findings.  On reevaluation patient feels improved but has ongoing nausea, is asking for ice chips.  Will plan for further IV  medicine, GI cocktail and reevaluation.  If he is able to tolerate p.o. I am hopeful for discharge home with outpatient GI follow-up.  He is on daily Protonix  and compliant.    Final diagnoses:  None    ED Discharge Orders     None          Bari Roxie HERO, DO 01/24/24 2316    Bari Roxie HERO, DO 01/24/24 2333

## 2024-01-25 LAB — URINALYSIS, ROUTINE W REFLEX MICROSCOPIC
Bacteria, UA: NONE SEEN
Glucose, UA: 100 mg/dL — AB
Leukocytes,Ua: NEGATIVE
Nitrite: NEGATIVE
Protein, ur: 100 mg/dL — AB
Specific Gravity, Urine: 1.02 (ref 1.005–1.030)
pH: 5.5 (ref 5.0–8.0)
